# Patient Record
Sex: Female | Born: 1935 | Race: White | Hispanic: No | Marital: Married | Smoking: Never smoker
Health system: Southern US, Community
[De-identification: ages and names within clinical notes are randomized; demographics above are authoritative.]

## PROBLEM LIST (undated history)

## (undated) DIAGNOSIS — E559 Vitamin D deficiency, unspecified: Secondary | ICD-10-CM

## (undated) DIAGNOSIS — R739 Hyperglycemia, unspecified: Secondary | ICD-10-CM

## (undated) DIAGNOSIS — E538 Deficiency of other specified B group vitamins: Secondary | ICD-10-CM

## (undated) DIAGNOSIS — R55 Syncope and collapse: Secondary | ICD-10-CM

## (undated) DIAGNOSIS — K8020 Calculus of gallbladder without cholecystitis without obstruction: Secondary | ICD-10-CM

## (undated) DIAGNOSIS — R079 Chest pain, unspecified: Secondary | ICD-10-CM

## (undated) DIAGNOSIS — E785 Hyperlipidemia, unspecified: Secondary | ICD-10-CM

## (undated) DIAGNOSIS — Z8673 Personal history of transient ischemic attack (TIA), and cerebral infarction without residual deficits: Secondary | ICD-10-CM

## (undated) DIAGNOSIS — I517 Cardiomegaly: Secondary | ICD-10-CM

## (undated) DIAGNOSIS — Z8543 Personal history of malignant neoplasm of ovary: Secondary | ICD-10-CM

## (undated) DIAGNOSIS — IMO0002 Reserved for concepts with insufficient information to code with codable children: Secondary | ICD-10-CM

## (undated) DIAGNOSIS — H8309 Labyrinthitis, unspecified ear: Secondary | ICD-10-CM

## (undated) DIAGNOSIS — N184 Chronic kidney disease, stage 4 (severe): Secondary | ICD-10-CM

## (undated) DIAGNOSIS — E042 Nontoxic multinodular goiter: Secondary | ICD-10-CM

## (undated) DIAGNOSIS — I6523 Occlusion and stenosis of bilateral carotid arteries: Secondary | ICD-10-CM

## (undated) DIAGNOSIS — F0390 Unspecified dementia without behavioral disturbance: Secondary | ICD-10-CM

## (undated) DIAGNOSIS — I10 Essential (primary) hypertension: Secondary | ICD-10-CM

## (undated) HISTORY — PX: BACK SURGERY: SHX140

## (undated) HISTORY — DX: Cardiomegaly: I51.7

## (undated) HISTORY — DX: Labyrinthitis, unspecified ear: H83.09

## (undated) HISTORY — DX: Unspecified dementia, unspecified severity, without behavioral disturbance, psychotic disturbance, mood disturbance, and anxiety: F03.90

## (undated) HISTORY — DX: Personal history of malignant neoplasm of ovary: Z85.43

## (undated) HISTORY — DX: Chest pain, unspecified: R07.9

## (undated) HISTORY — DX: Vitamin D deficiency, unspecified: E55.9

## (undated) HISTORY — DX: Personal history of transient ischemic attack (TIA), and cerebral infarction without residual deficits: Z86.73

## (undated) HISTORY — DX: Chronic kidney disease, stage 4 (severe): N18.4

## (undated) HISTORY — DX: Reserved for concepts with insufficient information to code with codable children: IMO0002

## (undated) HISTORY — DX: Calculus of gallbladder without cholecystitis without obstruction: K80.20

## (undated) HISTORY — DX: Syncope and collapse: R55

## (undated) HISTORY — DX: Occlusion and stenosis of bilateral carotid arteries: I65.23

## (undated) HISTORY — DX: Deficiency of other specified B group vitamins: E53.8

## (undated) HISTORY — DX: Hyperglycemia, unspecified: R73.9

## (undated) HISTORY — DX: Nontoxic multinodular goiter: E04.2

## (undated) HISTORY — DX: Essential (primary) hypertension: I10

## (undated) HISTORY — DX: Hyperlipidemia, unspecified: E78.5

---
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
## 1958-08-03 HISTORY — PX: VAGINAL HYSTERECTOMY: SHX2639

## 1997-11-01 DIAGNOSIS — I129 Hypertensive chronic kidney disease with stage 1 through stage 4 chronic kidney disease, or unspecified chronic kidney disease: Secondary | ICD-10-CM

## 1997-11-01 DIAGNOSIS — I10 Essential (primary) hypertension: Secondary | ICD-10-CM

## 1997-11-01 DIAGNOSIS — E785 Hyperlipidemia, unspecified: Secondary | ICD-10-CM

## 1997-11-01 DIAGNOSIS — N184 Chronic kidney disease, stage 4 (severe): Secondary | ICD-10-CM

## 1997-11-01 HISTORY — DX: Essential (primary) hypertension: I10

## 1997-11-01 HISTORY — DX: Hyperlipidemia, unspecified: E78.5

## 1997-11-14 ENCOUNTER — Other Ambulatory Visit: Admission: RE | Admit: 1997-11-14 | Discharge: 1997-11-14 | Payer: Self-pay | Admitting: Family Medicine

## 1999-06-20 ENCOUNTER — Other Ambulatory Visit: Admission: RE | Admit: 1999-06-20 | Discharge: 1999-06-20 | Payer: Self-pay | Admitting: Family Medicine

## 2000-06-18 ENCOUNTER — Other Ambulatory Visit: Admission: RE | Admit: 2000-06-18 | Discharge: 2000-06-18 | Payer: Self-pay | Admitting: Family Medicine

## 2000-12-01 DIAGNOSIS — IMO0002 Reserved for concepts with insufficient information to code with codable children: Secondary | ICD-10-CM

## 2000-12-01 HISTORY — DX: Reserved for concepts with insufficient information to code with codable children: IMO0002

## 2001-10-01 ENCOUNTER — Encounter: Payer: Self-pay | Admitting: Family Medicine

## 2001-10-01 LAB — CONVERTED CEMR LAB: Pap Smear: NORMAL

## 2001-10-21 ENCOUNTER — Other Ambulatory Visit: Admission: RE | Admit: 2001-10-21 | Discharge: 2001-10-21 | Payer: Self-pay | Admitting: Internal Medicine

## 2004-09-03 DIAGNOSIS — R7309 Other abnormal glucose: Secondary | ICD-10-CM

## 2004-09-09 ENCOUNTER — Ambulatory Visit: Payer: Self-pay | Admitting: Family Medicine

## 2004-09-11 ENCOUNTER — Ambulatory Visit: Payer: Self-pay | Admitting: Family Medicine

## 2004-09-16 ENCOUNTER — Ambulatory Visit: Payer: Self-pay

## 2004-09-18 ENCOUNTER — Ambulatory Visit: Payer: Self-pay | Admitting: Family Medicine

## 2004-11-11 ENCOUNTER — Ambulatory Visit: Payer: Self-pay | Admitting: Family Medicine

## 2005-02-10 ENCOUNTER — Ambulatory Visit: Payer: Self-pay | Admitting: Family Medicine

## 2005-02-19 ENCOUNTER — Ambulatory Visit: Payer: Self-pay

## 2005-02-19 HISTORY — PX: US ECHOCARDIOGRAPHY: HXRAD669

## 2005-04-03 ENCOUNTER — Ambulatory Visit: Payer: Self-pay | Admitting: Family Medicine

## 2005-04-22 ENCOUNTER — Ambulatory Visit: Payer: Self-pay

## 2005-09-03 ENCOUNTER — Encounter: Payer: Self-pay | Admitting: Family Medicine

## 2005-09-03 LAB — CONVERTED CEMR LAB
Hgb A1c MFr Bld: 5.5 %
Microalbumin U total vol: 6.7 mg/L

## 2005-09-22 ENCOUNTER — Ambulatory Visit: Payer: Self-pay | Admitting: Family Medicine

## 2005-09-24 ENCOUNTER — Ambulatory Visit: Payer: Self-pay | Admitting: Family Medicine

## 2005-11-05 ENCOUNTER — Ambulatory Visit: Payer: Self-pay

## 2005-11-25 ENCOUNTER — Ambulatory Visit: Payer: Self-pay | Admitting: Family Medicine

## 2005-11-26 ENCOUNTER — Ambulatory Visit: Payer: Self-pay | Admitting: Family Medicine

## 2006-01-20 ENCOUNTER — Ambulatory Visit: Payer: Self-pay | Admitting: Family Medicine

## 2006-01-22 ENCOUNTER — Ambulatory Visit: Payer: Self-pay | Admitting: Family Medicine

## 2006-06-28 ENCOUNTER — Ambulatory Visit: Payer: Self-pay

## 2006-07-23 ENCOUNTER — Ambulatory Visit: Payer: Self-pay | Admitting: Family Medicine

## 2006-11-03 ENCOUNTER — Ambulatory Visit: Payer: Self-pay | Admitting: Family Medicine

## 2006-11-03 LAB — CONVERTED CEMR LAB
ALT: 18 units/L (ref 0–40)
AST: 23 units/L (ref 0–37)
Albumin: 3.8 g/dL (ref 3.5–5.2)
Alkaline Phosphatase: 79 units/L (ref 39–117)
BUN: 32 mg/dL — ABNORMAL HIGH (ref 6–23)
Basophils Absolute: 0 10*3/uL (ref 0.0–0.1)
Basophils Relative: 0 % (ref 0.0–1.0)
Bilirubin, Direct: 0.1 mg/dL (ref 0.0–0.3)
CO2: 25 meq/L (ref 19–32)
Calcium: 9.2 mg/dL (ref 8.4–10.5)
Chloride: 114 meq/L — ABNORMAL HIGH (ref 96–112)
Cholesterol: 218 mg/dL (ref 0–200)
Creatinine, Ser: 1.6 mg/dL — ABNORMAL HIGH (ref 0.4–1.2)
Creatinine,U: 161.8 mg/dL
Direct LDL: 149.4 mg/dL
Eosinophils Absolute: 0.2 10*3/uL (ref 0.0–0.6)
Eosinophils Relative: 3.5 % (ref 0.0–5.0)
GFR calc Af Amer: 39 mL/min
GFR calc non Af Amer: 33 mL/min
Glucose, Bld: 101 mg/dL — ABNORMAL HIGH (ref 70–99)
HCT: 36.3 % (ref 36.0–46.0)
HDL: 46.2 mg/dL (ref >39.0)
Hemoglobin: 12.1 g/dL (ref 12.0–15.0)
Lymphocytes Relative: 40.3 % (ref 12.0–46.0)
MCHC: 33.4 g/dL (ref 30.0–36.0)
MCV: 89 fL (ref 78.0–100.0)
Microalb Creat Ratio: 6.8 mg/g (ref 0.0–30.0)
Microalb, Ur: 1.1 mg/dL (ref 0.0–1.9)
Monocytes Absolute: 0.4 10*3/uL (ref 0.2–0.7)
Monocytes Relative: 10.2 % (ref 3.0–11.0)
Neutro Abs: 2 10*3/uL (ref 1.4–7.7)
Neutrophils Relative %: 46 % (ref 43.0–77.0)
Platelets: 136 10*3/uL — ABNORMAL LOW (ref 150–400)
Potassium: 4.8 meq/L (ref 3.5–5.1)
RBC: 4.08 M/uL (ref 3.87–5.11)
RDW: 13.8 % (ref 11.5–14.6)
Sodium: 143 meq/L (ref 135–145)
TSH: 1.3 microintl units/mL (ref 0.35–5.50)
Total Bilirubin: 1.1 mg/dL (ref 0.3–1.2)
Total CHOL/HDL Ratio: 4.7
Total Protein: 6.4 g/dL (ref 6.0–8.3)
Triglycerides: 144 mg/dL (ref 0–149)
VLDL: 29 mg/dL (ref 0–40)
WBC: 4.4 10*3/uL — ABNORMAL LOW (ref 4.5–10.5)

## 2006-11-08 ENCOUNTER — Ambulatory Visit: Payer: Self-pay | Admitting: Family Medicine

## 2006-11-09 ENCOUNTER — Ambulatory Visit: Payer: Self-pay

## 2006-11-15 ENCOUNTER — Ambulatory Visit: Payer: Self-pay | Admitting: Family Medicine

## 2006-12-08 ENCOUNTER — Ambulatory Visit: Payer: Self-pay | Admitting: Family Medicine

## 2007-04-18 ENCOUNTER — Encounter: Payer: Self-pay | Admitting: Family Medicine

## 2007-04-18 DIAGNOSIS — Z8543 Personal history of malignant neoplasm of ovary: Secondary | ICD-10-CM

## 2007-04-18 DIAGNOSIS — H8309 Labyrinthitis, unspecified ear: Secondary | ICD-10-CM | POA: Insufficient documentation

## 2007-05-13 ENCOUNTER — Ambulatory Visit: Payer: Self-pay | Admitting: Family Medicine

## 2007-05-13 DIAGNOSIS — N289 Disorder of kidney and ureter, unspecified: Secondary | ICD-10-CM | POA: Insufficient documentation

## 2007-05-15 LAB — CONVERTED CEMR LAB
Albumin: 4 g/dL (ref 3.5–5.2)
BUN: 35 mg/dL — ABNORMAL HIGH (ref 6–23)
CO2: 26 meq/L (ref 19–32)
Calcium: 8.9 mg/dL (ref 8.4–10.5)
Chloride: 111 meq/L (ref 96–112)
Creatinine, Ser: 1.6 mg/dL — ABNORMAL HIGH (ref 0.4–1.2)
GFR calc Af Amer: 39 mL/min
GFR calc non Af Amer: 33 mL/min
Glucose, Bld: 104 mg/dL — ABNORMAL HIGH (ref 70–99)
Phosphorus: 4.1 mg/dL (ref 2.3–4.6)
Potassium: 4.8 meq/L (ref 3.5–5.1)
Sodium: 142 meq/L (ref 135–145)

## 2007-05-17 ENCOUNTER — Ambulatory Visit: Payer: Self-pay

## 2007-05-17 ENCOUNTER — Ambulatory Visit: Payer: Self-pay | Admitting: Family Medicine

## 2007-11-14 ENCOUNTER — Ambulatory Visit: Payer: Self-pay | Admitting: Family Medicine

## 2007-11-14 LAB — CONVERTED CEMR LAB
ALT: 17 units/L (ref 0–35)
AST: 23 units/L (ref 0–37)
Albumin: 4 g/dL (ref 3.5–5.2)
BUN: 34 mg/dL — ABNORMAL HIGH (ref 6–23)
Basophils Relative: 1.1 % — ABNORMAL HIGH (ref 0.0–1.0)
CO2: 24 meq/L (ref 19–32)
Chloride: 109 meq/L (ref 96–112)
Creatinine, Ser: 1.6 mg/dL — ABNORMAL HIGH (ref 0.4–1.2)
Direct LDL: 184.4 mg/dL
Eosinophils Relative: 1.6 % (ref 0.0–5.0)
HDL: 42.3 mg/dL (ref >39.0)
Lymphocytes Relative: 35.1 % (ref 12.0–46.0)
MCV: 92.1 fL (ref 78.0–100.0)
Neutrophils Relative %: 54.3 % (ref 43.0–77.0)
RBC: 4.3 M/uL (ref 3.87–5.11)
VLDL: 36 mg/dL (ref 0–40)
WBC: 5.4 10*3/uL (ref 4.5–10.5)

## 2007-11-17 ENCOUNTER — Ambulatory Visit: Payer: Self-pay | Admitting: Family Medicine

## 2007-12-15 ENCOUNTER — Ambulatory Visit: Payer: Self-pay | Admitting: Cardiology

## 2007-12-21 ENCOUNTER — Encounter: Payer: Self-pay | Admitting: Family Medicine

## 2007-12-21 ENCOUNTER — Encounter: Payer: Self-pay | Admitting: Cardiology

## 2007-12-21 ENCOUNTER — Ambulatory Visit: Payer: Self-pay

## 2007-12-21 HISTORY — PX: OTHER SURGICAL HISTORY: SHX169

## 2007-12-28 ENCOUNTER — Encounter: Payer: Self-pay | Admitting: Family Medicine

## 2008-01-11 ENCOUNTER — Ambulatory Visit: Payer: Self-pay | Admitting: Cardiology

## 2008-05-22 ENCOUNTER — Ambulatory Visit: Payer: Self-pay

## 2008-05-22 ENCOUNTER — Encounter: Payer: Self-pay | Admitting: Family Medicine

## 2008-05-22 ENCOUNTER — Ambulatory Visit: Payer: Self-pay | Admitting: Cardiology

## 2008-10-09 ENCOUNTER — Ambulatory Visit: Payer: Self-pay | Admitting: Family Medicine

## 2008-10-09 DIAGNOSIS — R079 Chest pain, unspecified: Secondary | ICD-10-CM | POA: Insufficient documentation

## 2008-10-09 DIAGNOSIS — I6529 Occlusion and stenosis of unspecified carotid artery: Secondary | ICD-10-CM | POA: Insufficient documentation

## 2009-05-07 ENCOUNTER — Encounter: Payer: Self-pay | Admitting: Cardiology

## 2009-05-07 ENCOUNTER — Ambulatory Visit: Payer: Self-pay

## 2009-05-15 ENCOUNTER — Ambulatory Visit: Payer: Self-pay | Admitting: Cardiology

## 2009-05-15 DIAGNOSIS — R55 Syncope and collapse: Secondary | ICD-10-CM

## 2009-11-04 ENCOUNTER — Encounter: Payer: Self-pay | Admitting: Cardiology

## 2009-11-05 ENCOUNTER — Ambulatory Visit: Payer: Self-pay

## 2009-11-05 ENCOUNTER — Encounter: Payer: Self-pay | Admitting: Cardiology

## 2010-03-11 ENCOUNTER — Encounter (INDEPENDENT_AMBULATORY_CARE_PROVIDER_SITE_OTHER): Payer: Self-pay | Admitting: *Deleted

## 2010-05-05 ENCOUNTER — Encounter: Payer: Self-pay | Admitting: Cardiology

## 2010-05-06 ENCOUNTER — Ambulatory Visit: Payer: Self-pay

## 2010-05-06 ENCOUNTER — Encounter: Payer: Self-pay | Admitting: Cardiology

## 2010-06-02 ENCOUNTER — Ambulatory Visit: Payer: Self-pay | Admitting: Cardiovascular Disease

## 2010-06-05 ENCOUNTER — Ambulatory Visit: Payer: Self-pay | Admitting: Family Medicine

## 2010-07-09 ENCOUNTER — Ambulatory Visit: Payer: Self-pay | Admitting: Family Medicine

## 2010-08-14 ENCOUNTER — Ambulatory Visit
Admission: RE | Admit: 2010-08-14 | Discharge: 2010-08-14 | Payer: Self-pay | Source: Home / Self Care | Attending: Family Medicine | Admitting: Family Medicine

## 2010-08-14 ENCOUNTER — Other Ambulatory Visit: Payer: Self-pay | Admitting: Family Medicine

## 2010-08-14 LAB — CBC WITH DIFFERENTIAL/PLATELET
Basophils Absolute: 0 10*3/uL (ref 0.0–0.1)
Basophils Relative: 0.4 % (ref 0.0–3.0)
Eosinophils Absolute: 0.1 10*3/uL (ref 0.0–0.7)
Eosinophils Relative: 2.3 % (ref 0.0–5.0)
HCT: 36.6 % (ref 36.0–46.0)
Hemoglobin: 12.4 g/dL (ref 12.0–15.0)
Lymphocytes Relative: 34.3 % (ref 12.0–46.0)
Lymphs Abs: 2.1 10*3/uL (ref 0.7–4.0)
MCHC: 33.8 g/dL (ref 30.0–36.0)
MCV: 91.7 fl (ref 78.0–100.0)
Monocytes Absolute: 0.5 10*3/uL (ref 0.1–1.0)
Monocytes Relative: 8.8 % (ref 3.0–12.0)
Neutro Abs: 3.3 10*3/uL (ref 1.4–7.7)
Neutrophils Relative %: 54.2 % (ref 43.0–77.0)
Platelets: 176 10*3/uL (ref 150.0–400.0)
RBC: 3.99 Mil/uL (ref 3.87–5.11)
RDW: 13.3 % (ref 11.5–14.6)
WBC: 6.2 10*3/uL (ref 4.5–10.5)

## 2010-08-14 LAB — TSH: TSH: 1.86 u[IU]/mL (ref 0.35–5.50)

## 2010-08-14 LAB — RENAL FUNCTION PANEL
Albumin: 4.2 g/dL (ref 3.5–5.2)
BUN: 45 mg/dL — ABNORMAL HIGH (ref 6–23)
CO2: 20 mEq/L (ref 19–32)
Calcium: 8.6 mg/dL (ref 8.4–10.5)
Chloride: 112 mEq/L (ref 96–112)
Creatinine, Ser: 2 mg/dL — ABNORMAL HIGH (ref 0.4–1.2)
GFR: 25 mL/min — ABNORMAL LOW (ref >60.00)
Glucose, Bld: 90 mg/dL (ref 70–99)
Phosphorus: 4.8 mg/dL — ABNORMAL HIGH (ref 2.3–4.6)
Potassium: 4.9 mEq/L (ref 3.5–5.1)
Sodium: 142 mEq/L (ref 135–145)

## 2010-08-14 LAB — LIPID PANEL
Cholesterol: 176 mg/dL (ref 0–200)
HDL: 51.9 mg/dL (ref >39.00)
LDL Cholesterol: 100 mg/dL — ABNORMAL HIGH (ref 0–99)
Total CHOL/HDL Ratio: 3
Triglycerides: 121 mg/dL (ref 0.0–149.0)
VLDL: 24.2 mg/dL (ref 0.0–40.0)

## 2010-08-14 LAB — HEPATIC FUNCTION PANEL
ALT: 14 U/L (ref 0–35)
AST: 18 U/L (ref 0–37)
Albumin: 4.2 g/dL (ref 3.5–5.2)
Alkaline Phosphatase: 65 U/L (ref 39–117)
Bilirubin, Direct: 0.1 mg/dL (ref 0.0–0.3)
Total Bilirubin: 1 mg/dL (ref 0.3–1.2)
Total Protein: 6.7 g/dL (ref 6.0–8.3)

## 2010-08-14 LAB — MICROALBUMIN / CREATININE URINE RATIO
Creatinine,U: 118 mg/dL
Microalb Creat Ratio: 1.6 mg/g (ref 0.0–30.0)
Microalb, Ur: 1.9 mg/dL (ref 0.0–1.9)

## 2010-08-20 ENCOUNTER — Encounter: Payer: Self-pay | Admitting: Family Medicine

## 2010-08-20 ENCOUNTER — Ambulatory Visit
Admission: RE | Admit: 2010-08-20 | Discharge: 2010-08-20 | Payer: Self-pay | Source: Home / Self Care | Attending: Family Medicine | Admitting: Family Medicine

## 2010-08-20 DIAGNOSIS — N184 Chronic kidney disease, stage 4 (severe): Secondary | ICD-10-CM | POA: Insufficient documentation

## 2010-09-02 NOTE — Miscellaneous (Signed)
Summary: Orders Update  Clinical Lists Changes  Orders: Added new Test order of Carotid Duplex (Carotid Duplex) - Signed 

## 2010-09-02 NOTE — Miscellaneous (Signed)
Summary: Orders Update  Clinical Lists Changes 

## 2010-09-02 NOTE — Assessment & Plan Note (Signed)
Summary: 1 MONTH FOLLOW UP/[facility]   Vital Signs:  Patient profile:   77 year old female Weight:      154.75 pounds Temp:     97.5 degrees F oral Pulse rate:   60 / minute Pulse rhythm:   regular BP sitting:   138 / 68  (left arm) Cuff size:   large  Vitals Entered By: Sydell Axon LPN (July 09, 2010 11:40 AM) CC: One month follow-up   History of Present Illness: Pt here for one month followup of her BP. She had just had her medication adjusted at [facility] prior to being seen last time and needed time to equilibrate on that medication. She feels well but is developing some wobbliness with getting up that she identifies as the same thing her sister had when she fell backward off her porch into a hole, hitting her head and dying a week later in ICU. She is tearful telling this story and not worried but wants to share her sxs. She has Carotid Dz, hasn't been checked for two years duet to not being seen. She feels well otherwise.  Problems Prior to Update: 1)  Syncope and Collapse  (ICD-780.2) 2)  Carotid Artery Stenosis, Bilateral  (ICD-433.10) 3)  Chest Pain  (ICD-786.50) 4)  Health Maintenance Exam  (ICD-V70.0) 5)  Disorder, Kidney/ureter Nos  (ICD-593.9) 6)  Hyperglycemia  (ICD-790.29) 7)  Cardiomegaly, Mild Via Xray  (ICD-429.3) 8)  Cholelithiasis  (ICD-574.20) 9)  Hx, Personal, Malignancy, Ovary  (ICD-V10.43) 10)  Vertigo Secondary To Labrynthitis  (ICD-386.30) 11)  Hyperlipidemia  (ICD-272.4) 12)  Hypertension  (ICD-401.9)  Medications Prior to Update: 1)  Norvasc 5 Mg  Tabs (Amlodipine Besylate) .... Take 1 Tablet By Mouth Once A Day 2)  Captopril 50 Mg  Tabs (Captopril) .... Take 1 Tablet By Mouth Three Times A Day 3)  Maxzide 75-50 Mg  Tabs (Triamterene-Hctz) .... Take 1 Tablet By Mouth Once A Day 4)  Propranolol Hcl 40 Mg  Tabs (Propranolol Hcl) .Marland Kitchen.. 1 and 1/2 Tablets By Mouth Twice A Day 5)  Crestor 10 Mg Tabs (Rosuvastatin Calcium) .... Take One Tablet By Mouth  Daily. 6)  Aspirin 81 Mg Tbec (Aspirin) .... Take One Tablet By Mouth As Needed  Allergies: 1)  ! Pcn  Physical Exam  General:  Well-developed,well-nourished,in no acute distress; alert,appropriate and cooperative throughout examination Head:  Normocephalic and atraumatic without obvious abnormalities. No apparent alopecia or balding. Eyes:  Conjunctiva clear bilaterally.  Ears:  External ear exam shows no significant lesions or deformities.  Otoscopic examination reveals clear canals, tympanic membranes are intact bilaterally without bulging, retraction, inflammation or discharge. Hearing is grossly normal bilaterally. Nose:  External nasal examination shows no deformity or inflammation. Nasal mucosa are pink and moist without lesions or exudates. Mouth:  Oral mucosa and oropharynx without lesions or exudates.  Teeth in good repair. Neck:  No deformities, masses, or tenderness noted. Mildly tender to palpation laterally, not c/w ant or post cerv chains. No swelling or color changes noted. Chest Wall:  No deformities, masses, or tenderness noted. Lungs:  Normal respiratory effort, chest expands symmetrically. Lungs are clear to auscultation, no crackles or wheezes. Heart:  Normal rate and regular rhythm. S1 and S2 normal without gallop, murmur, click, rub or other extra sounds. Abdomen:  Bowel sounds positive,abdomen soft and non-tender without masses, organomegaly or hernias noted. Neurologic:  No cranial nerve deficits noted. Station and gait are normal for age.  Sensory, motor and coordinative functions appear  intact.   Impression & Recommendations:  Problem # 1:  HYPERTENSION (ICD-401.9) Assessment Unchanged BP 165/70 by me. Will increase Captopril to 100mg  three times a day. Take 11/2 of 50mg  tabs until gone and then fill script for 100mg  tabs.  Be careful with lightheadedness for a few days until used to the new dose. RTC early if dizziness continues. Her updated medication list for  this problem includes:    Norvasc 5 Mg Tabs (Amlodipine besylate) .Marland Kitchen... Take 1 tablet by mouth once a day    Captopril 100 Mg Tabs (Captopril) ..... One tab by mouth three times a day    Maxzide 75-50 Mg Tabs (Triamterene-hctz) .Marland Kitchen... Take 1 tablet by mouth once a day    Propranolol Hcl 40 Mg Tabs (Propranolol hcl) .Marland Kitchen... 1 and 1/2 tablets by mouth twice a day  BP today: 138/68 Prior BP: 150/70 (06/05/2010)  Labs Reviewed: K+: 4.5 (11/14/2007) Creat: : 1.6 (11/14/2007)   Chol: 261 (11/14/2007)   HDL: 42.3 (11/14/2007)   LDL: DEL (11/14/2007)   TG: 178 (11/14/2007)  Problem # 2:  CAROTID ARTERY STENOSIS, BILATERAL (ICD-433.10) Assessment: Unchanged  Will get Carotid U/s next time if BP controlled or better. Her updated medication list for this problem includes:    Aspirin 81 Mg Tbec (Aspirin) .Marland Kitchen... Take one tablet by mouth as needed  Carotid Duplex Scan: Progression of bilateral carotid artery disease 60-79% RICA stenosis 40-59% LICA stenosis  (05/07/2009)  Echocardiogram:   -  Left ventricular ejection fraction was estimated to be 60 %. Left         ventricular wall thickness was mildly increased. Features         were consistent with a pseudonormal left ventricular filling         pattern, with concomitant abnormal relaxation and increased         filling pressure.   -  The aortic valve was mildly to moderately calcified. There was         mild aortic valvular regurgitation. The mean transaortic         valve gradient was 6 mmHg. Estimated aortic valve area (by         VTI) was 2.89 cm^2. Estimated aortic valve area (by Vmax) was         3.19 cm^2.   -  There was mild ascending aortic dilatation.   -  There was mild mitral annular calcification. There was mild         mitral valvular regurgitation.   -  The left atrium was mild to moderately dilated. (12/21/2007)  Complete Medication List: 1)  Norvasc 5 Mg Tabs (Amlodipine besylate) .... Take 1 tablet by mouth once a day 2)   Captopril 100 Mg Tabs (Captopril) .... One tab by mouth three times a day 3)  Maxzide 75-50 Mg Tabs (Triamterene-hctz) .... Take 1 tablet by mouth once a day 4)  Propranolol Hcl 40 Mg Tabs (Propranolol hcl) .Marland Kitchen.. 1 and 1/2 tablets by mouth twice a day 5)  Crestor 10 Mg Tabs (Rosuvastatin calcium) .... Take one tablet by mouth daily. 6)  Aspirin 81 Mg Tbec (Aspirin) .... Take one tablet by mouth as needed  Patient Instructions: 1)  RTC as scheduled. 2)  Get Carotid U/S next time. Prescriptions: CAPTOPRIL 100 MG TABS (CAPTOPRIL) one tab by mouth three times a day  #90 x 12   Entered and Authorized by:   Shaune Leeks MD   Signed by:   Shaune Leeks  MD on 07/09/2010   Method used:   Print then Give to Patient   RxID:   [id number]    Orders Added: 1)  Est. Patient Level III [50093]    Current Allergies (reviewed today): ! PCN

## 2010-09-02 NOTE — Assessment & Plan Note (Signed)
Summary: EC6/AMD  Medications Added NORVASC 5 MG  TABS (AMLODIPINE BESYLATE) Take 1 tablet by mouth once a day CAPTOPRIL 50 MG  TABS (CAPTOPRIL) Take 1 tablet by mouth three times a day PROPRANOLOL HCL 40 MG  TABS (PROPRANOLOL HCL) 1 and 1/2 tablets by mouth twice a day CRESTOR 10 MG TABS (ROSUVASTATIN CALCIUM) Take one tablet by mouth daily. ASPIRIN 81 MG TBEC (ASPIRIN) Take one tablet by mouth daily      Allergies Added:   Visit Type:  Initial Consult Referring Provider:  Dr. Daleen Fuentes Primary Provider:  Shaune Leeks MD  CC:  Angela Fuentes. has shortness of breath.  Denies chest pain.Marland Kitchen  History of Present Illness: [name] is a very pleasant 77 year old woman with a remote history of syncope, hyperlipidemia, peripheral vascular disease with bilateral carotid arterial disease that has been stable, hypertension who presents for routine followup.  she reports that she has been out of her medications for the past few days. Her blood pressure is elevated in the office today. She is otherwise been feeling well with no complaints apart from some neck discomfort which has been chronic. She denies any chest pain, is able to exert herself without any difficulty. She used to be on a cholesterol medication as well as aspirin and she is uncertain why she's not taking his medications. She denies ever having any allergies or side effects from medications such as myalgias or bleeding.  EKG shows normal sinus rhythm with rate 69 beats per minute, no significant ST or T wave changes  Current Medications (verified): 1)  Norvasc 5 Mg  Tabs (Amlodipine Besylate) .... Take 1 Tablet By Mouth Once A Day 2)  Captopril 50 Mg  Tabs (Captopril) .... Take 1 Tablet By Mouth Three Times A Day 3)  Maxzide 75-50 Mg  Tabs (Triamterene-Hctz) .... Take 1 Tablet By Mouth Once A Day 4)  Propranolol Hcl 40 Mg  Tabs (Propranolol Hcl) .Marland Kitchen.. 1 and 1/2 Tablets By Mouth Twice A Day  Allergies (verified): 1)  ! Pcn  Past  History:  Past Medical History: Last updated: 04/18/2007 Hypertension (11/01/1997) Hyperlipidemia (11/01/1997) Cholelithiasis ([birth year]1949)  Past Surgical History: Last updated: 10/11/2008 Back Surg L5/S1 Dr Angela Fuentes Cervical series/MRI neck multi lvl degen changes 5/02 Dexa nml 1/02 Cervical x-ray DDD 11/04 Hysterectomy ovarian cancer 1960's Carotid U/S mild  to mod plaque R)  greater than L) 09/16/04 Echo EF 65% tr AR grossly nml 02/19/05 Carotid U/S 60-79 RICA stable 11/05/05 Carotid U/S 60-79 RICA stable 06/30/06 Carotid U/S stable 60-79 RICA 0-39  LICA 11/09/06 Carotid U/S stable 60-79 RICA 40-59 LICA 05/17/07 Adenosine Myoview Nml 12/21/07 Carotid U/S 60-79 RICA 0-39 LICA stable  05/22/08  Family History: Last updated: 11/17/2007 Father dec Prostate Ca Mother dec 71 HTN CVA(Hemm) Brother dec killed Brother dec Parkinson's, starved to death Brother dec Emphysema Sister dec NH Blind (Angela Fuentes) SIster dec Early Dementia (Angela Fuentes) Sister A Angela Fuentes) Sister A Angela Fuentes)  Social History: Last updated: 04/18/2007 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 Married lives w/ husb  0children  Risk Factors: Caffeine Use: 3 (11/17/2007) Exercise: yes (11/17/2007)  Risk Factors: Smoking Status: never (04/18/2007) Passive Smoke Exposure: no (11/17/2007)  Review of Systems  The patient denies fever, weight loss, weight gain, vision loss, decreased hearing, hoarseness, chest pain, syncope, dyspnea on exertion, peripheral edema, prolonged cough, abdominal pain, incontinence, muscle weakness, depression, and enlarged lymph nodes.    Vital Signs:  Patient profile:   77 year old female Height:  65 inches Weight:      153 pounds BMI:     25.55 Pulse rate:   72 / minute BP sitting:   172 / 73  (left arm) Cuff size:   large  Vitals Entered By: Angela Fuentes, CMA (June 02, 2010 2:15 PM)  Physical Exam  General:  Well developed, well nourished, in no acute  distress. appears younger than her stated age Head:  normocephalic and atraumatic Neck:  Neck supple, no JVD. No masses, thyromegaly or abnormal cervical nodes. Lungs:  Clear bilaterally to auscultation and percussion. Heart:  Non-displaced PMI, chest non-tender; regular rate and rhythm, S1, S2 without murmurs, rubs or gallops. Carotid upstroke normal, 1+ bruit. Pedals normal pulses. No edema, no varicosities. Abdomen:  Bowel sounds positive; abdomen soft and non-tender without masses Msk:  Back normal, normal gait. Muscle strength and tone normal. Pulses:  pulses normal in all 4 extremities Extremities:  No clubbing or cyanosis. Neurologic:  Alert and oriented x 3. Skin:  Intact without lesions or rashes. Psych:  Normal affect.   Impression & Recommendations:  Problem # 1:  CAROTID ARTERY STENOSIS, BILATERAL (ICD-433.10) continue aggressive lipid management. She is not chronically on a statin and we will add Crestor 10 mg daily. She was previously on simvastatin 80 mg daily she is on a calcium channel blocker, this dose would have to be significantly decreased. We can change her Crestor to Lipitor if cost is an issue when it goes generic. Goal LDL is less than 70 given her severe carotid arterial disease. Add Aspirin 81 mg daily.  The following medications were removed from the medication list:    Adult Aspirin Low Strength 81 Mg Tbdp (Aspirin) .Marland Kitchen... Take 1 tablet by mouth once a day Her updated medication list for this problem includes:    Aspirin 81 Mg Tbec (Aspirin) .Marland Kitchen... Take one tablet by mouth daily  Orders: EKG w/ Interpretation (93000)  Problem # 2:  HYPERLIPIDEMIA (ICD-272.4) Add a statin as detailed above with a cholesterol check in 3 months time.  Her updated medication list for this problem includes:    Crestor 10 Mg Tabs (Rosuvastatin calcium) .Marland Kitchen... Take one tablet by mouth daily.  Problem # 3:  HYPERTENSION (ICD-401.9) Blood pressure is elevated today and she has  run out of several of her medicines. We will renew her medications. We'll also recommend followup with  [facility] in one month's time to review her blood pressure and medications.  The following medications were removed from the medication list:    Adult Aspirin Low Strength 81 Mg Tbdp (Aspirin) .Marland Kitchen... Take 1 tablet by mouth once a day Her updated medication list for this problem includes:    Norvasc 5 Mg Tabs (Amlodipine besylate) .Marland Kitchen... Take 1 tablet by mouth once a day    Captopril 50 Mg Tabs (Captopril) .Marland Kitchen... Take 1 tablet by mouth three times a day    Maxzide 75-50 Mg Tabs (Triamterene-hctz) .Marland Kitchen... Take 1 tablet by mouth once a day    Propranolol Hcl 40 Mg Tabs (Propranolol hcl) .Marland Kitchen... 1 and 1/2 tablets by mouth twice a day    Aspirin 81 Mg Tbec (Aspirin) .Marland Kitchen... Take one tablet by mouth daily  Patient Instructions: 1)  Your physician has recommended you make the following change in your medication: START crestor daily and aspirin 81mg   2)  Your physician wants you to follow-up in:   6 months You will receive a reminder letter in the mail two months in advance. If  you don't receive a letter, please call our office to schedule the follow-up appointment. 3)  Your physician recommends that you schedule a follow-up appointment in: with Dr. Milinda Antis or Dayton Martes first available  Prescriptions: CRESTOR 10 MG TABS (ROSUVASTATIN CALCIUM) Take one tablet by mouth daily.  #30 x 6   Entered by:   Benedict Needy, RN   Authorized by:   Dossie Arbour MD   Signed by:   Benedict Needy, RN on 06/02/2010   Method used:   Electronically to        [facility] [phone]* (retail)       [address] [place], [place]  [address]       Ph: [phone]       Fax: [phone]   RxID:   [id number] PROPRANOLOL HCL 40 MG  TABS (PROPRANOLOL HCL) 1 and 1/2 tablets by mouth twice a day  #90 x 6   Entered by:   Benedict Needy, RN   Authorized by:   Dossie Arbour MD   Signed by:   Benedict Needy, RN  on 06/02/2010   Method used:   Electronically to        [facility] [phone]* (retail)       [address] [place], [place]  [address]       Ph: [phone]       Fax: [phone]   RxID:   [id number] MAXZIDE 75-50 MG  TABS (TRIAMTERENE-HCTZ) Take 1 tablet by mouth once a day  #30 x 6   Entered by:   Benedict Needy, RN   Authorized by:   Dossie Arbour MD   Signed by:   Benedict Needy, RN on 06/02/2010   Method used:   Electronically to        [facility] [phone]* (retail)       [address] [place], [place]  [address]       Ph: [phone]       Fax: [phone]   RxID:   [id number] CAPTOPRIL 50 MG  TABS (CAPTOPRIL) Take 1 tablet by mouth three times a day  #90 x 6   Entered by:   Benedict Needy, RN   Authorized by:   Dossie Arbour MD   Signed by:   Benedict Needy, RN on 06/02/2010   Method used:   Electronically to        [facility] [phone]* (retail)       [address] [place], [place]  [address]       Ph: [phone]       Fax: [phone]   RxID:   [id number] NORVASC 5 MG  TABS (AMLODIPINE BESYLATE) Take 1 tablet by mouth once a day  #30 x 6   Entered by:   Benedict Needy, RN   Authorized by:   Dossie Arbour MD   Signed by:   Benedict Needy, RN on 06/02/2010   Method used:   Electronically to        [facility] [phone]* (retail)       [address] [place], [place]  [address]       Ph: [phone]       Fax: [phone]   RxID:   [id number]

## 2010-09-02 NOTE — Letter (Signed)
Summary: Nadara Eaton letter  [place] at [facility]  [address] [place], [place] [address]   Phone: [phone]  Fax: [phone]       03/11/2010 MRN: [id number]  [facility] [address] [place], [place]  [address]  Dear Ms. [facility] [place], and [facility] announce the retirement of Arta Silence, M.D., from full-time practice at the [facility] office effective January 30, 2010 and his plans of returning part-time.  It is important to Dr. Hetty Ely and to our practice that you understand that [facility] has seven physicians in our office for your health care needs.  We will continue to offer the same exceptional care that you have today.    Dr. Hetty Ely has spoken to many of you about his plans for retirement and returning part-time in the fall.   We will continue to work with you through the transition to schedule appointments for you in the office and meet the high standards that [facility] is committed to.   Again, it is with great pleasure that we share the news that Dr. Hetty Ely will return to [facility] in October of 2011 with a reduced schedule.    If you have any questions, or would like to request an appointment with one of our physicians, please call us at [phone] and press the option for Scheduling an appointment.  We take pleasure in providing you with excellent patient care and look forward to seeing you at your next office visit.  Our [facility] Physicians are:  Tillman Abide, M.D. Laurita Quint, M.D. Roxy Manns, M.D. Kerby Nora, M.D. Hannah Beat, M.D. Ruthe Mannan, M.D. We proudly welcomed Raechel Ache, M.D. and Eustaquio Boyden, M.D. to the practice in July/August 2011.  Sincerely,  [facility]

## 2010-09-02 NOTE — Assessment & Plan Note (Signed)
Summary: F/U/CLE   Vital Signs:  Patient profile:   77 year old female Weight:      155.25 pounds Temp:     97.3 degrees F oral Pulse rate:   64 / minute Pulse rhythm:   regular BP sitting:   150 / 70  (left arm) Cuff size:   large  Vitals Entered By: Sydell Axon LPN (June 05, 2010 2:39 PM) CC: follow-up visit   History of Present Illness: Pt has not been here in a long time. She was started on Crestor by Dr Mariah Milling recently. She feels fine most of the time. She is dizzy when she gets up and this is chronic.  Her sister fell off of her porch and was taken to [facility]  and then died quickly therafter, 31/2 -4 yrs ago. She was not supposed to go out of the house.....she was stubborn....we are all stubborn.   Her last U/S was 4/11. She had been out of her meds for a few days and were refilled by Dr Mariah Milling.  She has not felt bad so did not think she needed to be seen. She is fine today except for soreness of the neck, laterally.  Allergies: 1)  ! Pcn  Physical Exam  General:  Well-developed,well-nourished,in no acute distress; alert,appropriate and cooperative throughout examination Head:  Normocephalic and atraumatic without obvious abnormalities. No apparent alopecia or balding. Eyes:  Conjunctiva clear bilaterally.  Ears:  External ear exam shows no significant lesions or deformities.  Otoscopic examination reveals clear canals, tympanic membranes are intact bilaterally without bulging, retraction, inflammation or discharge. Hearing is grossly normal bilaterally. Nose:  External nasal examination shows no deformity or inflammation. Nasal mucosa are pink and moist without lesions or exudates. Mouth:  Oral mucosa and oropharynx without lesions or exudates.  Teeth in good repair. Neck:  No deformities, masses, or tenderness noted. Mildly tender to palpation laterally, not c/w ant or post cerv chains. No swelling or color changes noted. Chest Wall:  No deformities, masses, or  tenderness noted. Lungs:  Normal respiratory effort, chest expands symmetrically. Lungs are clear to auscultation, no crackles or wheezes. Heart:  Normal rate and regular rhythm. S1 and S2 normal without gallop, murmur, click, rub or other extra sounds.   Impression & Recommendations:  Problem # 1:  HYPERTENSION (ICD-401.9) Assessment Improved Better than at cardiology but still mlildly elevated. Will recheck in one month and adjust meds as needed. Her updated medication list for this problem includes:    Norvasc 5 Mg Tabs (Amlodipine besylate) .Marland Kitchen... Take 1 tablet by mouth once a day    Captopril 50 Mg Tabs (Captopril) .Marland Kitchen... Take 1 tablet by mouth three times a day    Maxzide 75-50 Mg Tabs (Triamterene-hctz) .Marland Kitchen... Take 1 tablet by mouth once a day    Propranolol Hcl 40 Mg Tabs (Propranolol hcl) .Marland Kitchen... 1 and 1/2 tablets by mouth twice a day  BP today: 150/70 Prior BP: 172/73 (06/02/2010)  Labs Reviewed: K+: 4.5 (11/14/2007) Creat: : 1.6 (11/14/2007)   Chol: 261 (11/14/2007)   HDL: 42.3 (11/14/2007)   LDL: DEL (11/14/2007)   TG: 178 (11/14/2007)  Problem # 2:  CAROTID ARTERY STENOSIS, BILATERAL (ICD-433.10) Assessment: Unchanged UTD, U/S in 4/12. Her updated medication list for this problem includes:    Aspirin 81 Mg Tbec (Aspirin) .Marland Kitchen... Take one tablet by mouth as needed  Problem # 3:  DISORDER, KIDNEY/URETER NOS (ICD-593.9) Assessment: Unchanged Will recheck with Comp Exam.  Problem # 4:  HYPERLIPIDEMIA (  ICD-272.4) Assessment: Unchanged  Will recheck at Comp Exam. Just put on Crestor. Her updated medication list for this problem includes:    Crestor 10 Mg Tabs (Rosuvastatin calcium) .Marland Kitchen... Take one tablet by mouth daily.  Labs Reviewed: SGOT: 23 (11/14/2007)   SGPT: 17 (11/14/2007)   HDL:42.3 (11/14/2007), 46.2 (11/03/2006)  LDL:DEL (11/14/2007), DEL (11/03/2006)  Chol:261 (11/14/2007), 218 (11/03/2006)  Trig:178 (11/14/2007), 144 (11/03/2006)  Complete Medication List: 1)   Norvasc 5 Mg Tabs (Amlodipine besylate) .... Take 1 tablet by mouth once a day 2)  Captopril 50 Mg Tabs (Captopril) .... Take 1 tablet by mouth three times a day 3)  Maxzide 75-50 Mg Tabs (Triamterene-hctz) .... Take 1 tablet by mouth once a day 4)  Propranolol Hcl 40 Mg Tabs (Propranolol hcl) .Marland Kitchen.. 1 and 1/2 tablets by mouth twice a day 5)  Crestor 10 Mg Tabs (Rosuvastatin calcium) .... Take one tablet by mouth daily. 6)  Aspirin 81 Mg Tbec (Aspirin) .... Take one tablet by mouth as needed  Patient Instructions: 1)  RTC 1 mo for BP check. 2)  Pls schedule for Comp Exam when able with labs prior.   Orders Added: 1)  Est. Patient Level III [[address]]    Current Allergies (reviewed today): ! PCN

## 2010-09-04 NOTE — Assessment & Plan Note (Signed)
Summary: CPX/RBH   Vital Signs:  Patient profile:   77 year old female Weight:      152 pounds Temp:     97.6 degrees F oral Pulse rate:   64 / minute Pulse rhythm:   regular BP sitting:   120 / 76  (left arm) Cuff size:   large  Vitals Entered By: Sydell Axon LPN (August 20, 2010 10:41 AM) CC: 30 Minute checkup   History of Present Illness: Pt here for Comp Exam. She continues to have dizziness that happens typically when she gets up from sitting. Her back bothers her a lot which goes away with sitting for a while. It is not the kind of pain she had with her cancer in the past.  Preventive Screening-Counseling & Management  Alcohol-Tobacco     Alcohol drinks/day: 0     Smoking Status: never     Passive Smoke Exposure: no  Caffeine-Diet-Exercise     Caffeine use/day: 1     Does Patient Exercise: no     Type of exercise: walks a little     Times/week: 3  Problems Prior to Update: 1)  Syncope and Collapse  (ICD-780.2) 2)  Carotid Artery Stenosis, Bilateral  (ICD-433.10) 3)  Chest Pain  (ICD-786.50) 4)  Health Maintenance Exam  (ICD-V70.0) 5)  Disorder, Kidney/ureter Nos  (ICD-593.9) 6)  Hyperglycemia  (ICD-790.29) 7)  Cardiomegaly, Mild Via Xray  (ICD-429.3) 8)  Cholelithiasis  (ICD-574.20) 9)  Hx, Personal, Malignancy, Ovary  (ICD-V10.43) 10)  Vertigo Secondary To Labrynthitis  (ICD-386.30) 11)  Hyperlipidemia  (ICD-272.4) 12)  Hypertension  (ICD-401.9)  Medications Prior to Update: 1)  Norvasc 5 Mg  Tabs (Amlodipine Besylate) .... Take 1 Tablet By Mouth Once A Day 2)  Captopril 100 Mg Tabs (Captopril) .... One Tab By Mouth Three Times A Day 3)  Maxzide 75-50 Mg  Tabs (Triamterene-Hctz) .... Take 1 Tablet By Mouth Once A Day 4)  Propranolol Hcl 40 Mg  Tabs (Propranolol Hcl) .Marland Kitchen.. 1 and 1/2 Tablets By Mouth Twice A Day 5)  Crestor 10 Mg Tabs (Rosuvastatin Calcium) .... Take One Tablet By Mouth Daily. 6)  Aspirin 81 Mg Tbec (Aspirin) .... Take One Tablet By Mouth  As Needed  Current Medications (verified): 1)  Norvasc 5 Mg  Tabs (Amlodipine Besylate) .... Take 1 Tablet By Mouth Once A Day 2)  Maxzide 75-50 Mg  Tabs (Triamterene-Hctz) .... Take 1 Tablet By Mouth Once A Day 3)  Propranolol Hcl 40 Mg  Tabs (Propranolol Hcl) .Marland Kitchen.. 1 and 1/2 Tablets By Mouth Twice A Day 4)  Crestor 10 Mg Tabs (Rosuvastatin Calcium) .... Take One Tablet By Mouth Daily. 5)  Aspirin 81 Mg Tbec (Aspirin) .... Take One Tablet By Mouth As Needed 6)  Captopril 50 Mg Tabs (Captopril) .... Take  One By Mouth Three Times A Day  Allergies: 1)  ! Pcn  Past History:  Past Medical History: Last updated: 04/18/2007 Hypertension (11/01/1997) Hyperlipidemia (11/01/1997) Cholelithiasis (08/03/1949)  Past Surgical History: Last updated: 10/11/2008 Back Surg L5/S1 Dr Ocie Bob Cervical series/MRI neck multi lvl degen changes 5/02 Dexa nml 1/02 Cervical x-ray DDD 11/04 Hysterectomy ovarian cancer 1960's Carotid U/S mild  to mod plaque R)  greater than L) 09/16/04 Echo EF 65% tr AR grossly nml 02/19/05 Carotid U/S 60-79 RICA stable 11/05/05 Carotid U/S 60-79 RICA stable 06/30/06 Carotid U/S stable 60-79 RICA 0-39  LICA 11/09/06 Carotid U/S stable 60-79 RICA 40-59 LICA 05/17/07 Adenosine Myoview Nml 12/21/07 Carotid U/S 60-79 RICA 0-39  LICA stable  05/22/08  Family History: Last updated: 08/20/2010 Father dec Prostate Ca Mother dec 71 HTN CVA(Hemm) Brother dec killed Brother dec Parkinson's, starved to death Brother dec Emphysema Sister dec NH Blind (MABEL) SIster dec Early Dementia (Myrtle Inge) Sister A 50 Renea Ee) Sister A 26 Doristine Johns)  Social History: Last updated: 04/18/2007 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 Married lives w/ husb  0children  Risk Factors: Alcohol Use: 0 (08/20/2010) Caffeine Use: 1 (08/20/2010) Exercise: no (08/20/2010)  Risk Factors: Smoking Status: never (08/20/2010) Passive Smoke Exposure: no (08/20/2010)  Family  History: Father dec Prostate Ca Mother dec 71 HTN CVA(Hemm) Brother dec killed Brother dec Parkinson's, starved to death Brother dec Emphysema Sister dec NH Blind (MABEL) SIster dec Early Dementia (Myrtle Inge) Sister A 33 Renea Ee) Sister A 44 Doristine Johns)  Social History: Caffeine use/day:  1 Does Patient Exercise:  no  Review of Systems General:  Denies chills, fatigue, fever, sweats, weakness, and weight loss. Eyes:  Denies blurring, discharge, and eye pain; No chasnges..was turned down for her driver's license.. ENT:  Complains of decreased hearing; denies earache and ringing in ears. CV:  Complains of chest pain or discomfort and swelling of feet; denies fainting, fatigue, and shortness of breath with exertion; occas, much like her mother was. Occas swelling Has lightheadedness. Resp:  Denies cough, shortness of breath, and wheezing. GI:  Denies abdominal pain, bloody stools, change in bowel habits, constipation, dark tarry stools, diarrhea, indigestion, loss of appetite, nausea, vomiting, vomiting blood, and yellowish skin color. GU:  Complains of nocturia and urinary frequency; denies discharge and dysuria. MS:  Complains of joint pain and low back pain; denies muscle aches and cramps; occas. Derm:  Denies dryness, itching, and rash. Neuro:  Complains of poor balance; denies numbness, tingling, and tremors; with getting up from dizziness.Marland Kitchen  Physical Exam  General:  Well-developed,well-nourished,in no acute distress; alert,appropriate and cooperative throughout examination Head:  Normocephalic and atraumatic without obvious abnormalities. No apparent alopecia or balding. Sinuses NT, slight touchiness to left max prominence. Eyes:  Conjunctiva clear bilaterally.  Ears:  External ear exam shows no significant lesions or deformities.  Otoscopic examination reveals clear canals, tympanic membranes are intact bilaterally without bulging, retraction, inflammation or discharge. Hearing is  grossly normal bilaterally. Nose:  External nasal examination shows no deformity or inflammation. Nasal mucosa are pink and moist without lesions or exudates. Mouth:  Oral mucosa and oropharynx without lesions or exudates.  Teeth in good repair. Neck:  No deformities, masses, or tenderness noted. Mildly tender to palpation laterally, not c/w ant or post cerv chains. No swelling or color changes noted. Chest Wall:  No deformities, masses, or tenderness noted. Breasts:  Not done. Lungs:  Normal respiratory effort, chest expands symmetrically. Lungs are clear to auscultation, no crackles or wheezes. Heart:  Normal rate and regular rhythm. S1 and S2 normal without gallop, murmur, click, rub or other extra sounds. Abdomen:  Bowel sounds positive,abdomen soft and non-tender without masses, organomegaly or hernias noted. Rectal:  Not done. Genitalia:  Not done. Msk:  No deformity or scoliosis noted of thoracic or lumbar spine.   Pulses:  R and L carotid,radial,femoral,dorsalis pedis and posterior tibial pulses are full and equal bilaterally Extremities:  No clubbing, cyanosis, edema, or deformity noted with normal full range of motion of all joints.   Neurologic:  No cranial nerve deficits noted. Station and gait are wide based, normal for age. Has mild unsteadiness with first getting up, clears quickly. Sensory, motor and coordinative functions  appear intact. Skin:  Intact without suspicious lesions or rashes, some AKs and SKs on the trunk. Cervical Nodes:  No lymphadenopathy noted Inguinal Nodes:  No significant adenopathy Psych:  Cognition and judgment appear intact. Alert and cooperative with normal attention span and concentration. No apparent delusions, illusions, hallucinations. Very pleasant lady with good eye contact.   Impression & Recommendations:  Problem # 1:  HEALTH MAINTENANCE EXAM (ICD-V70.0) Assessment Comment Only I have personally reviewed the Medicare Annual Wellness  questionnaire and have noted 1.   The patient's medical and social history 2.   Their use of alcohol, tobacco or illicit drugs 3.   Their current medications and supplements 4.   The patient's functional ability including ADL's, fall risks, home safety risks and hearing or visual             impairment. Has longtimew hearing impairment and has dizziness with first getting up. Encouraged her to wear support hose and get up slowly. 5.   Diet and physical activities 6.   Evidence for depression or mood disorders   Problem # 2:  RENAL INSUFFICIENCY (ICD-588.9) Slowly progressing. Would like pt to see Nephrology to assess. BP, sugar control  and fluid intake appear acceptable. Orders: Nephrology Referral (Nephro)  Problem # 3:  SYNCOPE AND COLLAPSE (ICD-780.2) Assessment: Unchanged None recently. Seems stable. She is very careful to take her time getting up, having watched her sister with the same problem.  Problem # 4:  HYPERTENSION (ICD-401.9) Assessment: Unchanged  Great response to curr tx...cont. The following medications were removed from the medication list:    Captopril 100 Mg Tabs (Captopril) ..... One tab by mouth three times a day Her updated medication list for this problem includes:    Norvasc 5 Mg Tabs (Amlodipine besylate) .Marland Kitchen... Take 1 tablet by mouth once a day    Maxzide 75-50 Mg Tabs (Triamterene-hctz) .Marland Kitchen... Take 1 tablet by mouth once a day    Propranolol Hcl 40 Mg Tabs (Propranolol hcl) .Marland Kitchen... 1 and 1/2 tablets by mouth twice a day    Captopril 50 Mg Tabs (Captopril) .Marland Kitchen... Take  one by mouth three times a day  BP today: 120/76 Prior BP: 138/68 (07/09/2010)  Labs Reviewed: K+: 4.9 (08/14/2010) Creat: : 2.0 (08/14/2010)   Chol: 176 (08/14/2010)   HDL: 51.90 (08/14/2010)   LDL: 100 (08/14/2010)   TG: 121.0 (08/14/2010)  Problem # 5:  HYPERLIPIDEMIA (ICD-272.4) LDL not quite at goal but just recently changed to Crestor. Will follow for now. Her updated medication list  for this problem includes:    Crestor 10 Mg Tabs (Rosuvastatin calcium) .Marland Kitchen... Take one tablet by mouth daily.  Labs Reviewed: SGOT: 18 (08/14/2010)   SGPT: 14 (08/14/2010)   HDL:51.90 (08/14/2010), 42.3 (11/14/2007)  LDL:100 (08/14/2010), DEL (11/14/2007)  Chol:176 (08/14/2010), 261 (11/14/2007)  Trig:121.0 (08/14/2010), 178 (11/14/2007)  Complete Medication List: 1)  Norvasc 5 Mg Tabs (Amlodipine besylate) .... Take 1 tablet by mouth once a day 2)  Maxzide 75-50 Mg Tabs (Triamterene-hctz) .... Take 1 tablet by mouth once a day 3)  Propranolol Hcl 40 Mg Tabs (Propranolol hcl) .Marland Kitchen.. 1 and 1/2 tablets by mouth twice a day 4)  Crestor 10 Mg Tabs (Rosuvastatin calcium) .... Take one tablet by mouth daily. 5)  Aspirin 81 Mg Tbec (Aspirin) .... Take one tablet by mouth as needed 6)  Captopril 50 Mg Tabs (Captopril) .... Take  one by mouth three times a day  Patient Instructions: 1)  Refer to Nephrology. 2)  RTC 6mos for recheck. 3)  Get Td and Pnemovax then. 4)  Discuss Zostavax then.   Orders Added: 1)  Nephrology Referral [Nephro] 2)  Est. Patient 65& > [40981]    Current Allergies (reviewed today): ! PCN

## 2010-09-04 NOTE — Letter (Signed)
Summary: Nature conservation officer [employer] Wellness Visit Questionnaire   [employer] Annual Wellness Visit Questionnaire   Imported By: Beau Fanny 08/20/2010 16:42:55  _____________________________________________________________________  External Attachment:    Type:   Image     Comment:   External Document

## 2010-09-16 ENCOUNTER — Encounter: Payer: Self-pay | Admitting: Family Medicine

## 2010-09-24 NOTE — Letter (Signed)
Summary: Dr Thedore Mins new patient evaluation  Dr Thedore Mins new patient evaluation   Imported By: Kassie Mends 09/16/2010 09:28:33  _____________________________________________________________________  External Attachment:    Type:   Image     Comment:   External Document

## 2010-12-16 NOTE — Assessment & Plan Note (Signed)
[facility] OFFICE NOTE   NAME:Maciolek, AZUCENA DART                   MRN:          [id number]  DATE:05/22/2008                            DOB:          [birth year]    Ms. Spielmann returns today for followup of her history of syncope and  hypertension.   Please see my previous notes for details of our previous evaluation.  She is to have carotid Dopplers today in the office.   She feels well.  She has had no further presyncope or syncope.   Her blood pressure is 138/70, her pulse is 60 and regular.  The rest of  exam is unchanged.   Ms. Marut is doing well on her current medical therapy.  We will obtain  carotid Dopplers today in the office give her preliminary report.  Assuming these are stable.  She is doing well.  I will see her back in a  year.     Thomas C. Daleen Squibb, MD, [facility]  Electronically Signed    TCW/MedQ  DD: 05/22/2008  DT: 05/22/2008  Job #: [id number]

## 2010-12-16 NOTE — Assessment & Plan Note (Signed)
[facility]                            [place] OFFICE NOTE   NAME:Angela Fuentes, Angela Fuentes                   MRN:          [id number]  DATE:12/15/2007                            DOB:          [birth year]    REP0RT TITLE:  CARDIOLOGY CONSULTATION   I was asked by Dr. Verdell Face to evaluate Angela Fuentes with  cardiomegaly, two episodes of sharp stabbing chest pain with sweating  and what she also describes as one syncopal event last summer that I do  not think she has told anybody about.   HISTORY OF PRESENT ILLNESS:  She is 77 years of age, married.  She takes  care of her husband and does most of the outdoor work, including mowing.  She mows with a push mower and also a riding mower.   Last summer, she was out mowing.  The next thing she knows, she finds  herself on the ground.  She did not have any antecedent symptoms.  She  was clear when she came around.  She felt well before that.  She  apparently did not tell anybody about it, and her husband did not  witness it.  She has had no further syncopal events.   She has had two episodes, one before Thanksgiving while walking historic  area of [place], [place].  She has sharp stabbing pain, felt  very weak and presyncopal.   She has had one other spell a couple weeks ago that was similar.   She denies any exertional chest discomfort or angina, though she does  have some dyspnea on exertion.  She denies any palpitations or any  syncope with exertion.   No orthopnea, PND or peripheral edema.   PAST MEDICAL HISTORY:  She has no dye allergies.   ALLERGIES:  SHE IS INTOLERANT TO PENICILLIN.   CURRENT MEDICATIONS:  1. Norvasc 5 mg a day.  2. Captopril 50 mg p.o. t.i.d.  3. Maxzide 75/50 daily.  4. Aspirin 81 mg a day.  5. Simvastatin 80 mg nightly.  6. Propranolol 60 mg p.o. b.i.d.  7. Soma p.r.n.   She does not drink, smoke or use any significant caffeine.   She has had cancer  in the past and she says they took everything out  except what she needed to live by.  This was of the uterus and ovarian  area.  Details unknown.   FAMILY HISTORY:  No premature history of coronary disease.   SOCIAL HISTORY:  She is retired.  She is married.  She does not have any  children.   REVIEW OF SYSTEMS:  She has a history of constipation and arthritis in  her knees.  Otherwise, the review of systems are negative.   PHYSICAL EXAMINATION:  VITAL SIGNS:  Today, her blood pressure is  152/78, her pulse is 64 and regular.  She is 5 feet 7.  HEENT:  Normocephalic, atraumatic.  PERRL.  Extraocular movements intact.  Sclerae are clear.  Face symmetry is normal.  Dentition satisfactory.  NECK:  Supple.  Carotid upstrokes are equal bilateral without bruits, no  JVD.  Thyroid  is not enlarged.  Trachea is midline.  LUNGS:  Clear  HEART:  Reveals a nondisplaced PMI.  She has a soft systolic murmur  along the left sternal border.  There is no gallop.  S2 splits  physiologically.  ABDOMEN:  Soft, good bowel sounds.  No midline bruit.  No hepatomegaly.  EXTREMITIES:  Reveal no cyanosis, clubbing or edema.  Pulses are present  bilaterally.  NEURO:  Intact.  There is no sign of DVT.  MUSCULOSKELETAL:  Shows chronic arthritic changes.   Her EKG is completely normal.   ASSESSMENT:  1. Dyspnea on exertion, chest pain, presyncope, rule out obstructive      coronary artery disease.  2. History of syncope without any significant warning or antecedent      symptoms.  This has not recurred since last summer.  We need to      rule out any significant structural heart disease, including      pulmonary hypertension or any significant left ventricular      hypertrophy or left ventricular dysfunction.  She does have      cardiomegaly on chest x-ray.   I have arranged for her to have a 2-D echocardiogram and an adenosine  Myoview.  Once obtained, will have her return for discussion and follow-   up.     Thomas C. Daleen Squibb, MD, [facility]  Electronically Signed    TCW/MedQ  DD: 12/15/2007  DT: 12/15/2007  Job #: [id number]   cc:   Arta Silence, MD

## 2010-12-16 NOTE — Assessment & Plan Note (Signed)
[facility] OFFICE NOTE   NAME:Cross, Angela Fuentes                   MRN:          [id number]  DATE:01/11/2008                            DOB:          September 17, 1920    Ms. [name] returns today for further management and follow up of her  dyspnea on exertion, syncope, and chest pain.   She had a 2D echocardiogram which showed mild left ventricular  hypertrophy, EF 60%.  She has some diastolic dysfunction.  She had a  mild-to-moderately calcified aortic valve with no significant stenosis.  Mean gradient was only 6 mmHg.  There was mild mitral regurgitation, and  her left atrium was mild-to-moderately dilated.  Her pulmonary pressures  were only mildly increased.   She also had an adenosine Myoview, which showed no ischemia, EF of 72%.  She has known carotid disease and looking back her carotids on November 09, 2006, showed some mild progression in the left internal carotid artery.  She had antegrade flow in both vertebrals.  She is due for Dopplers in  October.   She has had no further episodes.   She is on a good medical program, though her blood pressure has not been  optimal here in the office.  Medicines are the same as last visit.   Her blood pressure today was 144/73, and her pulse is 56, and regular.  The rest of her exam is unchanged.   At this point in time, I think good blood pressure control is in order.  I am not sure why she has the syncopal events.  I would increase her  Norvasc to 10 mg a day if she continues to be hypertensive.   We will arrange for her to have carotid Dopplers in October.     Thomas C. Daleen Squibb, MD, [facility]  Electronically Signed    TCW/MedQ  DD: 01/11/2008  DT: 01/12/2008  Job #: [id number]   cc:   Arta Silence, MD

## 2010-12-26 ENCOUNTER — Ambulatory Visit (INDEPENDENT_AMBULATORY_CARE_PROVIDER_SITE_OTHER): Payer: Medicare Other | Admitting: Cardiovascular Disease

## 2010-12-26 ENCOUNTER — Encounter: Payer: Self-pay | Admitting: Cardiovascular Disease

## 2010-12-26 DIAGNOSIS — N289 Disorder of kidney and ureter, unspecified: Secondary | ICD-10-CM

## 2010-12-26 DIAGNOSIS — H8309 Labyrinthitis, unspecified ear: Secondary | ICD-10-CM

## 2010-12-26 DIAGNOSIS — E785 Hyperlipidemia, unspecified: Secondary | ICD-10-CM

## 2010-12-26 DIAGNOSIS — Z8543 Personal history of malignant neoplasm of ovary: Secondary | ICD-10-CM

## 2010-12-26 DIAGNOSIS — K8020 Calculus of gallbladder without cholecystitis without obstruction: Secondary | ICD-10-CM

## 2010-12-26 DIAGNOSIS — N259 Disorder resulting from impaired renal tubular function, unspecified: Secondary | ICD-10-CM

## 2010-12-26 DIAGNOSIS — I6529 Occlusion and stenosis of unspecified carotid artery: Secondary | ICD-10-CM

## 2010-12-26 DIAGNOSIS — R55 Syncope and collapse: Secondary | ICD-10-CM

## 2010-12-26 DIAGNOSIS — R7309 Other abnormal glucose: Secondary | ICD-10-CM

## 2010-12-26 DIAGNOSIS — I10 Essential (primary) hypertension: Secondary | ICD-10-CM

## 2010-12-26 DIAGNOSIS — R079 Chest pain, unspecified: Secondary | ICD-10-CM

## 2010-12-26 MED ORDER — LISINOPRIL 20 MG PO TABS: 20.0000 mg | ORAL_TABLET | Freq: Every day | ORAL | Status: DC

## 2010-12-26 MED ORDER — ATENOLOL 25 MG PO TABS: 25.0000 mg | ORAL_TABLET | Freq: Every day | ORAL | Status: DC

## 2010-12-26 NOTE — Patient Instructions (Addendum)
You are doing well. Please stop the propronolol and stop the captopril Start lisinopril 20 mg daily Start atenolol 25 mg daily Please call us if you have new issues that need to be addressed before your next appt.  We will call you for a follow up Appt. In 12 months Follow up with Dr. Hetty Ely Your physician has requested that you regularly monitor and record your blood pressure readings at home. Please use the same machine at the same time of day to check your readings and record them to bring to your follow-up visit.

## 2010-12-26 NOTE — Assessment & Plan Note (Signed)
She does report a rare episode of dizziness. We will need to monitor her blood pressure closely. It sounds orthostatic in nature and quickly resolves.

## 2010-12-26 NOTE — Assessment & Plan Note (Signed)
She is not taking her medication as prescribed. She takes captopril once a day, propranolol once a day. We will change her to lisinopril 20 mg daily, atenolol 25 mg daily. We will stop her captopril and propranolol. Last her to monitor her blood pressure. She is on a blood pressure cuff though she is able to check her blood pressure at her pharmacy. He also asked her to follow up with Dr. Hetty Ely.

## 2010-12-26 NOTE — Assessment & Plan Note (Signed)
60-70% right internal carotid arterial disease, mild to moderate disease on the left. We need to continue aggressive cholesterol management.

## 2010-12-26 NOTE — Assessment & Plan Note (Signed)
Cholesterol has improved though still not at goal. We'll recommend that we go to Crestor 20 mg daily.

## 2010-12-26 NOTE — Progress Notes (Signed)
   Patient ID: Angela Fuentes, female    DOB: 05-22-1921, 77 y.o.   MRN: [id number]  HPI Comments: Angela Fuentes is a very pleasant 77 year old woman with a remote history of syncope, hyperlipidemia, peripheral vascular disease with bilateral carotid arterial disease that has been stable, hypertension who presents for routine followup.   She reports that she is doing well. She is active, has no complaints of chest pain or shortness of breath. She takes all of her medications in the morning and does not take captopril 3 times a day and does not take propranolol twice a day. She has not read the labels on the medications. She otherwise feels well and has no complaints.   EKG shows normal sinus rhythm with rate 66 beats per minute, no significant ST or T wave changes        Review of Systems  Constitutional: Negative.   HENT: Negative.   Eyes: Negative.   Respiratory: Negative.   Cardiovascular: Negative.   Gastrointestinal: Negative.   Musculoskeletal: Negative.   Skin: Negative.   Neurological: Negative.   Hematological: Negative.   Psychiatric/Behavioral: Negative.   All other systems reviewed and are negative.    BP 140/68  Pulse 65  Ht 5\' 7"  (1.702 m)  Wt 151 lb (68.493 kg)  BMI 23.65 kg/m2  Physical Exam  Nursing note and vitals reviewed. Constitutional: She is oriented to person, place, and time. She appears well-developed and well-nourished.  HENT:  Head: Normocephalic.  Nose: Nose normal.  Mouth/Throat: Oropharynx is clear and moist.  Eyes: Conjunctivae are normal. Pupils are equal, round, and reactive to light.  Neck: Normal range of motion. Neck supple. No JVD present.  Cardiovascular: Normal rate, regular rhythm, S1 normal, S2 normal, normal heart sounds and intact distal pulses.  Exam reveals no gallop and no friction rub.   No murmur heard. Pulmonary/Chest: Effort normal and breath sounds normal. No respiratory distress. She has no wheezes. She has no rales. She  exhibits no tenderness.  Abdominal: Soft. Bowel sounds are normal. She exhibits no distension. There is no tenderness.  Musculoskeletal: Normal range of motion. She exhibits no edema and no tenderness.  Lymphadenopathy:    She has no cervical adenopathy.  Neurological: She is alert and oriented to person, place, and time. Coordination normal.  Skin: Skin is warm and dry. No rash noted. No erythema.  Psychiatric: She has a normal mood and affect. Her behavior is normal. Judgment and thought content normal.         Assessment and Plan

## 2010-12-31 ENCOUNTER — Telehealth: Payer: Self-pay | Admitting: *Deleted

## 2010-12-31 MED ORDER — ROSUVASTATIN CALCIUM 20 MG PO TABS: 20.0000 mg | ORAL_TABLET | Freq: Every day | ORAL | Status: DC

## 2010-12-31 NOTE — Telephone Encounter (Signed)
Spoke to pt, after her last ov, Dr. Mariah Milling had wanted to incr her Crestor from 10mg  to 20mg  once daily. Notified pt of this and she will pick up new prescription.

## 2011-01-24 ENCOUNTER — Encounter: Payer: Self-pay | Admitting: Family Medicine

## 2011-01-27 ENCOUNTER — Encounter: Payer: Self-pay | Admitting: Family Medicine

## 2011-01-27 ENCOUNTER — Ambulatory Visit (INDEPENDENT_AMBULATORY_CARE_PROVIDER_SITE_OTHER): Payer: Medicare Other | Admitting: Family Medicine

## 2011-01-27 VITALS — BP 220/88 | HR 80 | Temp 98.3°F | Ht 67.0 in | Wt 159.5 lb

## 2011-01-27 DIAGNOSIS — M7989 Other specified soft tissue disorders: Secondary | ICD-10-CM

## 2011-01-27 DIAGNOSIS — N259 Disorder resulting from impaired renal tubular function, unspecified: Secondary | ICD-10-CM

## 2011-01-27 DIAGNOSIS — I10 Essential (primary) hypertension: Secondary | ICD-10-CM

## 2011-01-27 DIAGNOSIS — I6529 Occlusion and stenosis of unspecified carotid artery: Secondary | ICD-10-CM

## 2011-01-27 DIAGNOSIS — R0609 Other forms of dyspnea: Secondary | ICD-10-CM

## 2011-01-27 DIAGNOSIS — I160 Hypertensive urgency: Secondary | ICD-10-CM

## 2011-01-27 LAB — COMPREHENSIVE METABOLIC PANEL
Albumin: 4.3 g/dL (ref 3.5–5.2)
Alkaline Phosphatase: 62 U/L (ref 39–117)
BUN: 24 mg/dL — ABNORMAL HIGH (ref 6–23)
CO2: 26 mEq/L (ref 19–32)
Calcium: 9.1 mg/dL (ref 8.4–10.5)
Chloride: 109 mEq/L (ref 96–112)
GFR: 41.79 mL/min — ABNORMAL LOW (ref >60.00)
Glucose, Bld: 98 mg/dL (ref 70–99)
Potassium: 4.4 mEq/L (ref 3.5–5.1)
Sodium: 144 mEq/L (ref 135–145)
Total Protein: 6.7 g/dL (ref 6.0–8.3)

## 2011-01-27 LAB — CBC WITH DIFFERENTIAL/PLATELET
Basophils Absolute: 0 10*3/uL (ref 0.0–0.1)
HCT: 34.1 % — ABNORMAL LOW (ref 36.0–46.0)
Lymphs Abs: 2.3 10*3/uL (ref 0.7–4.0)
Monocytes Relative: 7.9 % (ref 3.0–12.0)
Neutrophils Relative %: 58.3 % (ref 43.0–77.0)
Platelets: 184 10*3/uL (ref 150.0–400.0)
RDW: 14 % (ref 11.5–14.6)

## 2011-01-27 LAB — TSH: TSH: 0.59 u[IU]/mL (ref 0.35–5.50)

## 2011-01-27 MED ORDER — AMLODIPINE BESYLATE 5 MG PO TABS: 5.0000 mg | ORAL_TABLET | Freq: Every day | ORAL | Status: DC

## 2011-01-27 MED ORDER — TRIAMTERENE-HCTZ 75-50 MG PO TABS: 1.0000 | ORAL_TABLET | Freq: Every day | ORAL | Status: DC

## 2011-01-27 NOTE — Assessment & Plan Note (Signed)
Likely due to noncompliance and confusion with meds and how to take them.   Only brings 4 meds today (supposed to be on 5 per med list). Cleaned up med list today, again emphasized how to take all meds (all are now on a daily basis since recent change by cards to simplify). Discussed reasons to treat elevated BP. Discussed reasons to go to ER given elevated BP. Concern for developing CHF given leg swelling as well as JVD, check BNP.  If elevated, consider starting lasix, consider checking echo. F/u 1 wk.

## 2011-01-27 NOTE — Assessment & Plan Note (Signed)
Near fall from tripping with scabs currently on legs. Given L>R swelling, there is some concern for clot - sent for ultrasound eval.  Also checking BNP for CHF as some evidence on exam.

## 2011-01-27 NOTE — Patient Instructions (Addendum)
I'm worried about your heart and blood clot in leg. Pass by up front to schedule ultrasound of left leg. Blood pressure medicines -  1. amlodipine 5mg  once daily. 2. Lisinopril 20mg  once daily. 3. Atenolol 25mg  once daily. 4. maxzide 75/50mg  once daily. Cholesterol medicine -  1. crestor 20mg  once daily Daily baby aspirin.   Blood work today. RETURN TO SEE [place] in 1 week for follow up.

## 2011-01-27 NOTE — Assessment & Plan Note (Addendum)
See above. Again reviewed meds and how to take them. F/u 1 wk when back on meds, anticipate better control of bps.

## 2011-01-27 NOTE — Assessment & Plan Note (Signed)
seen by Dr. Monte Fantasia, reviewed office note from 09/2010. Recheck Cr today.

## 2011-01-27 NOTE — Progress Notes (Signed)
Subjective:    Patient ID: Angela Fuentes, female    DOB: Dec 09, 1920, 77 y.o.   MRN: [id number]  HPI CC: fall, HTN  Brings bag of meds - only crestor, lisinopril and atenolol in bag.  Unsure overall what she takes.  Has run out of amlodipine, states ran out a few days ago but brings bottle that is dated 06/2009.  Tries to remember to take baby aspirin daily, but sometimes forgets.  DOI: 2 wks ago.  No fall, just near fall after tripping over steps to get into house.  Denies premonitory sxs.  Injury to leg happened 2 wks ago.  Tripped last Saturday - when turning to go into house up steps, hit left leg with door.  Did not fall, able to hang on to rail to prevent fall.  Noting swelling since then.  Also having right foot swelling but not as bad.  Swelling on left up to mid leg.  Denies headaches, SOB, CP/tightness, nausea, cough.  + occasional dizziness.  Otherwise reports feeling well.  Actually came in because sister and husband kept telling her to, concerned about blood clot.  Tries to avoid doctors "I don't want to bother you all"  Medications and allergies reviewed and updated in chart. Patient Active Problem List  Diagnoses  . HYPERLIPIDEMIA  . VERTIGO SECONDARY TO LABRYNTHITIS  . HYPERTENSION  . CAROTID ARTERY STENOSIS, BILATERAL  . CHOLELITHIASIS  . DISORDER, KIDNEY/URETER NOS  . SYNCOPE AND COLLAPSE  . CHEST PAIN  . HYPERGLYCEMIA  . HX, PERSONAL, MALIGNANCY, OVARY  . RENAL INSUFFICIENCY   Past Medical History  Diagnosis Date  . Syncope and collapse   . Occlusion and stenosis of carotid artery without mention of cerebral infarction   . Chest pain, unspecified   . Routine general medical examination at a health care facility   . Unspecified disorder of kidney and ureter   . Other abnormal glucose   . Cardiomegaly   . Calculus of gallbladder without mention of cholecystitis or obstruction   . Other and unspecified hyperlipidemia 11/01/1997  . Unspecified essential  hypertension 11/01/1997  . Personal history of malignant neoplasm of ovary   . DDD (degenerative disc disease) 12/2000    Cervical series /MRI neck multi level degen changes 05/02: cervical x-ray DDD 06/2003  . Labyrinthitis, unspecified   . Cancer 1960's    Ovarian cancer//hysterectomy   Past Surgical History  Procedure Date  . Back surgery     L5/S1 ( Dr. Ocie Bob)  . Vaginal hysterectomy 1960    ovarian cancer   . Cholecystectomy 08/03/1949  . Carotid ultrasound 05/22/2008    carotid ultrasound stable 60-79 RICA  0-39LICA stable   . Adenosine myoview 12/21/2007    Normal  . US echocardiography 02/19/2005    Echo EF 65% tr AR grossly normal   History  Substance Use Topics  . Smoking status: Never Smoker   . Smokeless tobacco: Not on file  . Alcohol Use: No   Family History  Problem Relation Age of Onset  . Hypertension Mother   . Stroke Mother 20    cva (hemm)  . Cancer Father     Prostate cancer  . COPD Brother     emphysema   Allergies  Allergen Reactions  . Penicillins     REACTION: whelps   Current Outpatient Prescriptions on File Prior to Visit  Medication Sig Dispense Refill  . aspirin 81 MG tablet Take 81 mg by mouth daily.        Marland Kitchen  atenolol (TENORMIN) 25 MG tablet Take 1 tablet (25 mg total) by mouth daily.  30 tablet  6  . lisinopril (PRINIVIL,ZESTRIL) 20 MG tablet Take 1 tablet (20 mg total) by mouth daily.  30 tablet  6  . rosuvastatin (CRESTOR) 20 MG tablet Take 1 tablet (20 mg total) by mouth daily.  30 tablet  6  . DISCONTD: amLODipine (NORVASC) 5 MG tablet Take 5 mg by mouth daily.        Marland Kitchen DISCONTD: captopril (CAPOTEN) 50 MG tablet Take 50 mg by mouth 3 (three) times daily.        Marland Kitchen DISCONTD: propranolol (INDERAL) 40 MG tablet 1 and 1/2 tablets by mouth twice a day       . DISCONTD: triamterene-hydrochlorothiazide (MAXZIDE) 75-50 MG per tablet Take 1 tablet by mouth daily.         Review of Systems Per HPI    Objective:   Physical Exam    Nursing note and vitals reviewed. Constitutional: She is oriented to person, place, and time. She appears well-developed and well-nourished. No distress.  HENT:  Head: Atraumatic.  Mouth/Throat: Oropharynx is clear and moist. No oropharyngeal exudate.  Eyes: Conjunctivae and EOM are normal. Pupils are equal, round, and reactive to light. No scleral icterus.  Neck: Normal range of motion. Neck supple. Hepatojugular reflux and JVD present. Carotid bruit is present (R sided).  Cardiovascular: Normal rate, regular rhythm, normal heart sounds and intact distal pulses.   No murmur heard. Pulmonary/Chest: Effort normal and breath sounds normal. No respiratory distress. She has no wheezes. She has no rales.  Abdominal: Soft. Bowel sounds are normal.  Musculoskeletal: She exhibits edema (1+ pitting edema R, 2+ pitting edema L).  Neurological: She is alert and oriented to person, place, and time.  Skin: Skin is warm and dry. No rash noted.  Psychiatric: She has a normal mood and affect.          Assessment & Plan:

## 2011-01-27 NOTE — Assessment & Plan Note (Signed)
R bruit.  Recently crestor increased to 20mg .  Denies SOB, CP, CVA sxs.

## 2011-01-28 ENCOUNTER — Telehealth: Payer: Self-pay

## 2011-01-28 NOTE — Telephone Encounter (Signed)
Message copied by Patience Musca on Wed Jan 28, 2011 12:25 PM ------      Message from: Eustaquio Boyden      Created: Tue Jan 27, 2011 10:35 PM       Please notify kidney function actually improved but still slightly elevated at 1.3.  Sugar normal.  Liver normal.  Thyroid normal.        Test measuring how heart is pumping returned elevated.  Ensure taking maxzide, should help with leg swelling as well.  Will re evaluate next week.

## 2011-01-28 NOTE — Telephone Encounter (Signed)
Patient notified as instructed by telephone. Pt said she would go for her appt about her leg on Fri and see Dr. Sharen Fuentes in the office on 02/03/11.

## 2011-01-30 ENCOUNTER — Encounter (INDEPENDENT_AMBULATORY_CARE_PROVIDER_SITE_OTHER): Payer: Medicare Other | Admitting: *Deleted

## 2011-01-30 DIAGNOSIS — M7989 Other specified soft tissue disorders: Secondary | ICD-10-CM

## 2011-02-02 ENCOUNTER — Encounter: Payer: Self-pay | Admitting: Family Medicine

## 2011-02-03 ENCOUNTER — Encounter: Payer: Self-pay | Admitting: Family Medicine

## 2011-02-03 ENCOUNTER — Ambulatory Visit (INDEPENDENT_AMBULATORY_CARE_PROVIDER_SITE_OTHER): Payer: Medicare Other | Admitting: Family Medicine

## 2011-02-03 VITALS — BP 146/80 | HR 76 | Temp 97.7°F | Wt 153.0 lb

## 2011-02-03 DIAGNOSIS — N259 Disorder resulting from impaired renal tubular function, unspecified: Secondary | ICD-10-CM

## 2011-02-03 DIAGNOSIS — M7989 Other specified soft tissue disorders: Secondary | ICD-10-CM

## 2011-02-03 DIAGNOSIS — I10 Essential (primary) hypertension: Secondary | ICD-10-CM

## 2011-02-03 MED ORDER — FUROSEMIDE 20 MG PO TABS: 10.0000 mg | ORAL_TABLET | Freq: Every day | ORAL | Status: DC

## 2011-02-03 MED ORDER — TRIAMTERENE-HCTZ 37.5-25 MG PO TABS: 1.0000 | ORAL_TABLET | Freq: Every day | ORAL | Status: DC

## 2011-02-03 NOTE — Assessment & Plan Note (Signed)
Discussed CKD for patient, currently stage 3. Cr actually improved compared to last check.

## 2011-02-03 NOTE — Assessment & Plan Note (Signed)
LLE doppler negative for DVT. Improved edema but still present. Start lasix, low dose.  rtc 2 wks for labwork, 1 mo for f/u.

## 2011-02-03 NOTE — Patient Instructions (Signed)
Cut yellow pill in half (triamterene hydrochlorothiazide).  When I send in prescription, new dose will be 1 whole pill. Start lasix 10mg  1/2 pill daily. Continue crestor, aspirin, lisinopril, atenolol and amlodipine (norvasc) as before. Good to see you today, return in 1 month for follow up. Return to see Dr. Hetty Ely in 1 month for follow up. Return in 2 weeks for lab visit.

## 2011-02-03 NOTE — Assessment & Plan Note (Addendum)
signficant improvement since last visit, with discussion of how to take meds as well as refilled maxzide. Given elevated BNP and continued concern for CHF on exam with JVD and periph edema (although improved), have started lasix low dose.   As starting lasix, decrease maxzide dose. Anticipate diastolic dysfunction.   F/u 1 mo with PCP.   If not improved, consider echo.  Did not order today given seemed stable, improving on exam with current treatment plan.

## 2011-02-03 NOTE — Progress Notes (Signed)
Subjective:    Patient ID: Angela Fuentes, female    DOB: 03-20-1921, 77 y.o.   MRN: [id number]  HPI CC: 1 wk f/u  Seen last week with swelling in legs as well as hypertension.  BP better, still swelling some but improved.  Feeling normal.  Brings meds showing maxzide, amlodipine, lisinopril and atenolol, reports compliance with meds.  Denies low BPs.  Denies HA, dizziness, LOC or presyncope, CP/tightness, SOB, nausea.  LLE doppler showing no blood clots.  Remaining scabs on lower legs, not tender.  Reviewed blood work in detail with patient.  Cr better at 1.3, BNP elevated at 616.  Thyroid, liver, CBC overall WNL (Hgb 11.6) Pt denies cough, SOB, states leg swelling improving on maxzide.  BP Readings from Last 3 Encounters:  02/03/11 146/80  01/27/11 220/88  12/26/10 140/68   Last echo 2006, EF 65%, grossly normal. Wt Readings from Last 3 Encounters:  02/03/11 153 lb 0.6 oz (69.418 kg)  01/27/11 159 lb 8 oz (72.349 kg)  12/26/10 151 lb (68.493 kg)   Medications and allergies reviewed and updated in chart. Patient Active Problem List  Diagnoses  . HYPERLIPIDEMIA  . VERTIGO SECONDARY TO LABRYNTHITIS  . HYPERTENSION  . CAROTID ARTERY STENOSIS, BILATERAL  . CHOLELITHIASIS  . DISORDER, KIDNEY/URETER NOS  . SYNCOPE AND COLLAPSE  . CHEST PAIN  . HYPERGLYCEMIA  . HX, PERSONAL, MALIGNANCY, OVARY  . RENAL INSUFFICIENCY  . Hypertensive urgency  . Leg swelling   Past Medical History  Diagnosis Date  . Syncope and collapse   . Occlusion and stenosis of carotid artery without mention of cerebral infarction   . Chest pain, unspecified   . Routine general medical examination at a health care facility   . Unspecified disorder of kidney and ureter   . Other abnormal glucose   . Cardiomegaly   . Calculus of gallbladder without mention of cholecystitis or obstruction   . Other and unspecified hyperlipidemia 11/01/1997  . Unspecified essential hypertension 11/01/1997  .  Personal history of malignant neoplasm of ovary   . DDD (degenerative disc disease) 12/2000    Cervical series /MRI neck multi level degen changes 05/02: cervical x-ray DDD 06/2003  . Labyrinthitis, unspecified   . Cancer 1960's    Ovarian cancer//hysterectomy   Past Surgical History  Procedure Date  . Back surgery     L5/S1 ( Dr. Ocie Bob)  . Vaginal hysterectomy 1960    ovarian cancer   . Cholecystectomy 08/03/1949  . Carotid ultrasound 05/22/2008    carotid ultrasound stable 60-79 RICA  0-39LICA stable   . Adenosine myoview 12/21/2007    Normal  . US echocardiography 02/19/2005    Echo EF 65% tr AR grossly normal   History  Substance Use Topics  . Smoking status: Never Smoker   . Smokeless tobacco: Not on file  . Alcohol Use: No   Family History  Problem Relation Age of Onset  . Hypertension Mother   . Stroke Mother 38    cva (hemm)  . Cancer Father     Prostate cancer  . COPD Brother     emphysema   Allergies  Allergen Reactions  . Penicillins     REACTION: whelps   Current Outpatient Prescriptions on File Prior to Visit  Medication Sig Dispense Refill  . amLODipine (NORVASC) 5 MG tablet Take 1 tablet (5 mg total) by mouth daily.  30 tablet  11  . aspirin 81 MG tablet Take 81 mg by mouth daily.        Marland Kitchen  atenolol (TENORMIN) 25 MG tablet Take 1 tablet (25 mg total) by mouth daily.  30 tablet  6  . lisinopril (PRINIVIL,ZESTRIL) 20 MG tablet Take 1 tablet (20 mg total) by mouth daily.  30 tablet  6  . rosuvastatin (CRESTOR) 20 MG tablet Take 1 tablet (20 mg total) by mouth daily.  30 tablet  6  . DISCONTD: triamterene-hydrochlorothiazide (MAXZIDE) 75-50 MG per tablet Take 1 tablet by mouth daily.  30 tablet  11   Review of Systems Per HPI    Objective:   Physical Exam  Nursing note and vitals reviewed. Constitutional: She is oriented to person, place, and time. She appears well-developed and well-nourished. No distress.  HENT:  Head: Atraumatic.    Mouth/Throat: Oropharynx is clear and moist. No oropharyngeal exudate.  Eyes: Conjunctivae and EOM are normal. Pupils are equal, round, and reactive to light. No scleral icterus.  Neck: Normal range of motion. Neck supple. Hepatojugular reflux and JVD present. Carotid bruit is present (R sided).  Cardiovascular: Normal rate, regular rhythm, normal heart sounds and intact distal pulses.   No murmur heard. Pulmonary/Chest: Effort normal and breath sounds normal. No respiratory distress. She has no wheezes. She has no rales.       No crackles  Abdominal: Soft. Bowel sounds are normal.  Musculoskeletal: She exhibits edema (mild pitting edema R, 1+ pitting edema L).  Neurological: She is alert and oriented to person, place, and time.  Skin: Skin is warm and dry. No rash noted.       Scabs x2 LLE, x1 RLE, no erythema or drainage  Psychiatric: She has a normal mood and affect.          Assessment & Plan:

## 2011-02-11 ENCOUNTER — Ambulatory Visit (INDEPENDENT_AMBULATORY_CARE_PROVIDER_SITE_OTHER): Payer: Medicare Other | Admitting: Family Medicine

## 2011-02-11 ENCOUNTER — Encounter: Payer: Self-pay | Admitting: Family Medicine

## 2011-02-11 ENCOUNTER — Ambulatory Visit: Payer: Self-pay | Admitting: Family Medicine

## 2011-02-11 DIAGNOSIS — N259 Disorder resulting from impaired renal tubular function, unspecified: Secondary | ICD-10-CM

## 2011-02-11 DIAGNOSIS — H8309 Labyrinthitis, unspecified ear: Secondary | ICD-10-CM

## 2011-02-11 DIAGNOSIS — I10 Essential (primary) hypertension: Secondary | ICD-10-CM

## 2011-02-11 DIAGNOSIS — I6529 Occlusion and stenosis of unspecified carotid artery: Secondary | ICD-10-CM

## 2011-02-11 DIAGNOSIS — M7989 Other specified soft tissue disorders: Secondary | ICD-10-CM

## 2011-02-11 DIAGNOSIS — R0609 Other forms of dyspnea: Secondary | ICD-10-CM

## 2011-02-11 LAB — BASIC METABOLIC PANEL
BUN: 48 mg/dL — ABNORMAL HIGH (ref 6–23)
Creatinine, Ser: 1.9 mg/dL — ABNORMAL HIGH (ref 0.4–1.2)
GFR: 26.33 mL/min — ABNORMAL LOW (ref >60.00)

## 2011-02-11 LAB — BRAIN NATRIURETIC PEPTIDE: Pro B Natriuretic peptide (BNP): 263 pg/mL — ABNORMAL HIGH (ref 0.0–100.0)

## 2011-02-11 NOTE — Assessment & Plan Note (Signed)
Appears improved. Will recheck today.

## 2011-02-11 NOTE — Assessment & Plan Note (Addendum)
Better but still slightly high. Will cont to follow. BP Readings from Last 3 Encounters:  02/11/11 140/74  02/03/11 146/80  01/27/11 220/88

## 2011-02-11 NOTE — Assessment & Plan Note (Signed)
She declines PT for risk of falls and sxs are fleeting with change of position. Will continue to follow.

## 2011-02-11 NOTE — Assessment & Plan Note (Signed)
Improved. No edema today. Cont curr meds. Discussed being seen if recurred. Do not think she needs Echo at this point.

## 2011-02-11 NOTE — Progress Notes (Signed)
  Subjective:    Patient ID: Angela Fuentes, female    DOB: [birth year], 77 y.o.   MRN: [id number]  HPI Pt here for followup after being seen by Dr Reece Agar twice for HTN out of control with LE edema and presumed CHF with some renal insufficiency. HE has reorganized meds somewhat with last change of tapering off Maxzide and going to Lasix. She seems to have tolerated all of this well.     Review of Systems  Constitutional: Negative for fever, chills, diaphoresis, activity change and fatigue.  HENT: Negative for ear pain, congestion, rhinorrhea and postnasal drip.   Eyes: Negative for redness.  Respiratory: Negative for cough, chest tightness, shortness of breath and wheezing.   Cardiovascular: Negative for chest pain.  Neurological: Positive for light-headedness. Headaches: Has occas fleetibng lightheadedness with initially getting up. Does not think she has significant risk for falling.       Objective:   Physical Exam  Constitutional: She is oriented to person, place, and time. She appears well-developed and well-nourished. No distress.  HENT:  Head: Normocephalic and atraumatic.  Right Ear: External ear normal.  Left Ear: External ear normal.  Nose: Nose normal.  Mouth/Throat: Oropharynx is clear and moist. No oropharyngeal exudate.  Eyes: Conjunctivae and EOM are normal. Pupils are equal, round, and reactive to light.  Neck: Normal range of motion. Neck supple. No thyromegaly present.  Cardiovascular: Normal rate, regular rhythm and normal heart sounds.   Pulmonary/Chest: Effort normal and breath sounds normal. She has no wheezes. She has no rales.  Musculoskeletal: Normal range of motion. She exhibits no edema and no tenderness.       Legs healing with prolonged scab formation on two spots of lower left leg.  Lymphadenopathy:    She has no cervical adenopathy.  Neurological: She is alert and oriented to person, place, and time. No cranial nerve deficit. She exhibits normal muscle  tone. Coordination (slow but not abnml.) abnormal.  Skin: She is not diaphoretic.          Assessment & Plan:

## 2011-02-11 NOTE — Patient Instructions (Signed)
Will report labs on phone tree.

## 2011-02-11 NOTE — Assessment & Plan Note (Signed)
Does not appear active at this point. Will cont to follow.

## 2011-03-06 ENCOUNTER — Ambulatory Visit (INDEPENDENT_AMBULATORY_CARE_PROVIDER_SITE_OTHER): Payer: Medicare Other | Admitting: Family Medicine

## 2011-03-06 ENCOUNTER — Telehealth: Payer: Self-pay | Admitting: Family Medicine

## 2011-03-06 ENCOUNTER — Encounter: Payer: Self-pay | Admitting: Family Medicine

## 2011-03-06 ENCOUNTER — Ambulatory Visit: Payer: Medicare Other | Admitting: Family Medicine

## 2011-03-06 VITALS — BP 142/64 | HR 64 | Temp 97.9°F | Wt 146.5 lb

## 2011-03-06 DIAGNOSIS — E875 Hyperkalemia: Secondary | ICD-10-CM

## 2011-03-06 DIAGNOSIS — M7989 Other specified soft tissue disorders: Secondary | ICD-10-CM

## 2011-03-06 DIAGNOSIS — I10 Essential (primary) hypertension: Secondary | ICD-10-CM

## 2011-03-06 LAB — BASIC METABOLIC PANEL
Calcium: 9 mg/dL (ref 8.4–10.5)
Creatinine, Ser: 2.8 mg/dL — ABNORMAL HIGH (ref 0.4–1.2)
GFR: 16.72 mL/min — ABNORMAL LOW (ref >60.00)
Sodium: 141 mEq/L (ref 135–145)

## 2011-03-06 NOTE — Telephone Encounter (Signed)
Attempted to call patient. No answer no machine. Will try again tomorrow at Saturday clinic.

## 2011-03-06 NOTE — Progress Notes (Signed)
  Subjective:    Patient ID: Angela Fuentes, female    DOB: 1921/04/21, 77 y.o.   MRN: [id number]  HPI CC: f/u HTN  Here for f/u HTN - No vision changes, CP/tightness, SOB, leg swelling.  Endorses occasional HA, no change recently.  Endorses occasional dizziness, longstanding.  States feeling well overall.  Last visit found to have bump in Cr again.  Wt Readings from Last 3 Encounters:  03/06/11 146 lb 8 oz (66.452 kg)  02/11/11 148 lb 8 oz (67.359 kg)  02/03/11 153 lb 0.6 oz (69.418 kg)   Review of Systems Per HPI    Objective:   Physical Exam  Nursing note and vitals reviewed. Constitutional: She appears well-developed and well-nourished. No distress.  HENT:  Head: Normocephalic and atraumatic.  Mouth/Throat: Oropharynx is clear and moist. No oropharyngeal exudate.  Eyes: Conjunctivae and EOM are normal. Pupils are equal, round, and reactive to light. No scleral icterus.  Neck: Normal range of motion. Neck supple. Carotid bruit is not present.  Cardiovascular: Normal rate, regular rhythm, normal heart sounds and intact distal pulses.   No murmur heard. Pulmonary/Chest: Effort normal and breath sounds normal. No respiratory distress. She has no wheezes. She has no rales.  Musculoskeletal: She exhibits tenderness. She exhibits no edema.  Lymphadenopathy:    She has no cervical adenopathy.  Skin: Skin is warm and dry. No rash noted.  Psychiatric: She has a normal mood and affect.          Assessment & Plan:

## 2011-03-06 NOTE — Assessment & Plan Note (Signed)
BP stable. Compliant with meds. Recommend continue. Check Cr today.  If staying elevated, stop lasix.  O/w continue meds.

## 2011-03-06 NOTE — Telephone Encounter (Signed)
Please notify kidney function has deteriorated.  Potassium also elevated. Have her stop lasix.  Very important to push fluids over weekend. if at any point feeling worse (nausea, chest pain, shortness of breath), to go to ER for evaluation. Return Tuesday next week for blood draw to follow kidneys and potassium.

## 2011-03-06 NOTE — Patient Instructions (Signed)
Blood pressure looking ok today. Checked blood work today. Good to see you today.  Call us with questions.

## 2011-03-07 NOTE — Telephone Encounter (Signed)
Called pt, no answer.  Please call Monday.

## 2011-03-07 NOTE — Telephone Encounter (Signed)
Able to speak with patient. Advised to hold lasix and push plenty of fluid over weekend. Kim please call on Monday to schedule Tuesday lab visit to follow potassium and kidneys.

## 2011-03-07 NOTE — Telephone Encounter (Signed)
No answer no machine. Will try again later. I don't know if you want to continue to reach her over the weekend or not. If not, I will call her again Monday

## 2011-03-09 NOTE — Telephone Encounter (Signed)
Spoke with patient and lab appointment scheduled.  

## 2011-03-10 ENCOUNTER — Telehealth: Payer: Self-pay | Admitting: Family Medicine

## 2011-03-10 ENCOUNTER — Other Ambulatory Visit (INDEPENDENT_AMBULATORY_CARE_PROVIDER_SITE_OTHER): Payer: Medicare Other | Admitting: Family Medicine

## 2011-03-10 DIAGNOSIS — E875 Hyperkalemia: Secondary | ICD-10-CM

## 2011-03-10 DIAGNOSIS — N259 Disorder resulting from impaired renal tubular function, unspecified: Secondary | ICD-10-CM

## 2011-03-10 DIAGNOSIS — I10 Essential (primary) hypertension: Secondary | ICD-10-CM

## 2011-03-10 LAB — BASIC METABOLIC PANEL
BUN: 63 mg/dL — ABNORMAL HIGH (ref 6–23)
GFR: 21.79 mL/min — ABNORMAL LOW (ref >60.00)
Glucose, Bld: 99 mg/dL (ref 70–99)
Potassium: 5 mEq/L (ref 3.5–5.1)

## 2011-03-10 NOTE — Telephone Encounter (Signed)
Please notify kidney function some better, still worse than normal.  Recommend make sure she is staying well hydrated with 8 8oz cups water or fluid daily. Recheck blood work in 3 wks for f/u.

## 2011-03-10 NOTE — Telephone Encounter (Signed)
Patient notified. She will increase fluid intake. Lab appointment scheduled for 3 weeks.

## 2011-03-30 ENCOUNTER — Telehealth: Payer: Self-pay | Admitting: Family Medicine

## 2011-03-30 ENCOUNTER — Encounter: Payer: Self-pay | Admitting: Family Medicine

## 2011-03-30 ENCOUNTER — Other Ambulatory Visit (INDEPENDENT_AMBULATORY_CARE_PROVIDER_SITE_OTHER): Payer: Medicare Other | Admitting: Family Medicine

## 2011-03-30 DIAGNOSIS — N259 Disorder resulting from impaired renal tubular function, unspecified: Secondary | ICD-10-CM

## 2011-03-30 LAB — BASIC METABOLIC PANEL
CO2: 23 mEq/L (ref 19–32)
Calcium: 9 mg/dL (ref 8.4–10.5)
Creatinine, Ser: 1.9 mg/dL — ABNORMAL HIGH (ref 0.4–1.2)
Glucose, Bld: 87 mg/dL (ref 70–99)

## 2011-03-30 NOTE — Telephone Encounter (Signed)
Attempted to call patient. No answer-no machine. Will try again later.

## 2011-03-30 NOTE — Telephone Encounter (Signed)
Please notify kidney function slightly improved but remains poor. Pt has continued kidney insufficiency. Would like her to come in next month for office visit to recheck kidneys and blood pressure. Ensure she stays away from anti inflammatores like ibuprofen, alleve, motrin, advil.  Tylenol ok.

## 2011-03-31 NOTE — Telephone Encounter (Signed)
Patient notified and follow up scheduled

## 2011-04-29 ENCOUNTER — Ambulatory Visit (INDEPENDENT_AMBULATORY_CARE_PROVIDER_SITE_OTHER): Payer: Medicare Other | Admitting: Family Medicine

## 2011-04-29 ENCOUNTER — Encounter: Payer: Self-pay | Admitting: Family Medicine

## 2011-04-29 VITALS — BP 132/86 | HR 60 | Temp 98.2°F | Wt 145.2 lb

## 2011-04-29 DIAGNOSIS — E785 Hyperlipidemia, unspecified: Secondary | ICD-10-CM

## 2011-04-29 DIAGNOSIS — I10 Essential (primary) hypertension: Secondary | ICD-10-CM

## 2011-04-29 DIAGNOSIS — N259 Disorder resulting from impaired renal tubular function, unspecified: Secondary | ICD-10-CM

## 2011-04-29 DIAGNOSIS — Z23 Encounter for immunization: Secondary | ICD-10-CM

## 2011-04-29 LAB — RENAL FUNCTION PANEL
Albumin: 4.2 g/dL (ref 3.5–5.2)
BUN: 45 mg/dL — ABNORMAL HIGH (ref 6–23)
Glucose, Bld: 91 mg/dL (ref 70–99)
Phosphorus: 4.8 mg/dL — ABNORMAL HIGH (ref 2.3–4.6)
Potassium: 5 mEq/L (ref 3.5–5.1)

## 2011-04-29 MED ORDER — ROSUVASTATIN CALCIUM 20 MG PO TABS: 20.0000 mg | ORAL_TABLET | Freq: Every day | ORAL | Status: DC

## 2011-04-29 MED ORDER — LISINOPRIL 20 MG PO TABS: 20.0000 mg | ORAL_TABLET | Freq: Every day | ORAL | Status: DC

## 2011-04-29 MED ORDER — AMLODIPINE BESYLATE 5 MG PO TABS: 5.0000 mg | ORAL_TABLET | Freq: Every day | ORAL | Status: DC

## 2011-04-29 MED ORDER — ATENOLOL 25 MG PO TABS: 25.0000 mg | ORAL_TABLET | Freq: Every day | ORAL | Status: DC

## 2011-04-29 NOTE — Progress Notes (Signed)
  Subjective:    Patient ID: Angela Fuentes, female    DOB: September 15, 1920, 77 y.o.   MRN: [id number]  HPI CC: f/u HTN  1. HTN - No HA, vision changes, CP/tightness, SOB, leg swelling.  Compliant with meds x3.  Requests refill.  2. HLD - crestor, no myalgias.  3. CRI - baseline Cr seems to be around 1.9.  Previously elevated up to 2.3.  No urinary changes.  Knees tend to bother her - takes alleve rarely.  Doesn't bother her too much.  Lost partial teeth 3 mo ago.  Has not had chance to go to [place] to get new dentures.  Review of Systems Per HPI    Objective:   Physical Exam  Nursing note and vitals reviewed. Constitutional: She appears well-developed and well-nourished. No distress.  HENT:  Head: Normocephalic and atraumatic.  Mouth/Throat: Oropharynx is clear and moist. No oropharyngeal exudate.  Eyes: Conjunctivae and EOM are normal. Pupils are equal, round, and reactive to light. No scleral icterus.  Neck: Normal range of motion. Neck supple. No thyromegaly present.  Cardiovascular: Normal rate, regular rhythm, normal heart sounds and intact distal pulses.   No murmur heard. Pulmonary/Chest: Effort normal and breath sounds normal. No respiratory distress. She has no wheezes. She has no rales.  Musculoskeletal: She exhibits no edema.  Lymphadenopathy:    She has no cervical adenopathy.  Skin: Skin is warm and dry. No rash noted.  Psychiatric: She has a normal mood and affect.          Assessment & Plan:

## 2011-04-29 NOTE — Assessment & Plan Note (Signed)
Chronic, stable. rec take crestor at night time.

## 2011-04-29 NOTE — Assessment & Plan Note (Signed)
BP Readings from Last 3 Encounters:  04/29/11 132/86  03/06/11 142/64  02/11/11 140/74  Best control to date.  Chronic, stable.  With CRI, goal <130/80.  May consider increase in amlodipine next visit.

## 2011-04-29 NOTE — Patient Instructions (Addendum)
Crestor - try to take at night because it will work better to lower cholesterol. I'd rather you take tylenol instead of alleve for knee pain (better on the kidneys). You have some kidney insufficiency - I do want [place] to keep an eye on this.  Blood work today to check on this. I've refilled all your medicines with 3 month supply at a time. Return in 6 months for follow up (assuming blood work returns ok today).

## 2011-04-29 NOTE — Assessment & Plan Note (Signed)
Discussed dx chronic renal insufficiency, advised importance to avoid NSAIDs, stay well hydrated. Check Renal panel today. Last check CKD stage 4.

## 2011-04-29 NOTE — Progress Notes (Signed)
Addended by: Patience Musca on: 04/29/2011 03:06 PM   Modules accepted: Orders

## 2011-04-30 ENCOUNTER — Ambulatory Visit: Payer: Medicare Other | Admitting: Family Medicine

## 2011-05-14 ENCOUNTER — Other Ambulatory Visit: Payer: Self-pay | Admitting: Cardiology

## 2011-05-14 DIAGNOSIS — I6529 Occlusion and stenosis of unspecified carotid artery: Secondary | ICD-10-CM

## 2011-05-15 ENCOUNTER — Encounter (INDEPENDENT_AMBULATORY_CARE_PROVIDER_SITE_OTHER): Payer: Medicare Other | Admitting: *Deleted

## 2011-05-15 DIAGNOSIS — I6529 Occlusion and stenosis of unspecified carotid artery: Secondary | ICD-10-CM

## 2011-06-01 ENCOUNTER — Other Ambulatory Visit: Payer: Self-pay | Admitting: *Deleted

## 2011-06-01 NOTE — Telephone Encounter (Signed)
Received refill request for Lasix. Not on med list and it looks like you stopped it it August. Could you confirm that please?

## 2011-06-01 NOTE — Telephone Encounter (Signed)
Pharmacy notified.

## 2011-06-01 NOTE — Telephone Encounter (Signed)
Yes this was discontinued.  Thanks.

## 2011-06-12 ENCOUNTER — Encounter: Payer: Self-pay | Admitting: Family Medicine

## 2011-06-12 ENCOUNTER — Ambulatory Visit (INDEPENDENT_AMBULATORY_CARE_PROVIDER_SITE_OTHER): Payer: Medicare Other | Admitting: Family Medicine

## 2011-06-12 DIAGNOSIS — I10 Essential (primary) hypertension: Secondary | ICD-10-CM

## 2011-06-12 DIAGNOSIS — N259 Disorder resulting from impaired renal tubular function, unspecified: Secondary | ICD-10-CM

## 2011-06-12 NOTE — Assessment & Plan Note (Signed)
Chronic. Improved. Compliant with meds. Has been taking lasix although asked to stop 03/2011 2/2 bump in Cr. Will stop for now and reassess next visit.  Hopeful to improve dizziness off lasix.

## 2011-06-12 NOTE — Patient Instructions (Addendum)
Stop lasix and we will see if dizziness gets any better. If worsening in the meantime, let me know. Return after 08/21/2011 for a physical. No other changes to our medicines today.

## 2011-06-12 NOTE — Assessment & Plan Note (Signed)
Pt states saw renal this year, no records in our system. Will request records and review.

## 2011-06-12 NOTE — Progress Notes (Signed)
  Subjective:    Patient ID: Angela Fuentes, female    DOB: 05-07-1921, 77 y.o.   MRN: [id number]  HPI CC: 50mo f/u HTN, elevated Cr  1. HTN - brings all meds.  No HA, vision changes, CP/tightness, SOB, leg swelling. Requests refill.  Doesn't check bp at home.  Occasional dizziness, lightheadedness.  2. HLD - crestor, no myalgias.   3. CRI - baseline Cr seems to be around 1.9. Previously elevated up to 2.3. No urinary changes.  Pt states had nephrology eval and told normal.  Lab Results  Component Value Date   CREATININE 2.0* 04/29/2011   BP Readings from Last 3 Encounters:  06/12/11 136/70  04/29/11 132/86  03/06/11 142/64   Wt Readings from Last 3 Encounters:  06/12/11 143 lb 1.9 oz (64.919 kg)  04/29/11 145 lb 4 oz (65.885 kg)  03/06/11 146 lb 8 oz (66.452 kg)  has been losing weight, states trying. No fevers/chills, night sweats, back pain, joint pain, nausea, HA. No recent physical.  Review of Systems Per HPI    Objective:   Physical Exam  Nursing note and vitals reviewed. Constitutional: She appears well-developed and well-nourished. No distress.  HENT:  Head: Normocephalic and atraumatic.  Mouth/Throat: Oropharynx is clear and moist. No oropharyngeal exudate.  Eyes: Conjunctivae and EOM are normal. Pupils are equal, round, and reactive to light. No scleral icterus.  Neck: Normal range of motion. Neck supple. Carotid bruit is not present.  Cardiovascular: Normal rate, regular rhythm, normal heart sounds and intact distal pulses.   No murmur heard. Pulses:      Radial pulses are 2+ on the right side, and 2+ on the left side.       Dorsalis pedis pulses are 2+ on the right side, and 2+ on the left side.  Pulmonary/Chest: Effort normal and breath sounds normal. No respiratory distress. She has no wheezes. She has no rales.  Lymphadenopathy:    She has no cervical adenopathy.  Skin: Skin is warm and dry. No rash noted.      Assessment & Plan:

## 2011-06-15 ENCOUNTER — Encounter: Payer: Self-pay | Admitting: Family Medicine

## 2011-10-01 ENCOUNTER — Ambulatory Visit (INDEPENDENT_AMBULATORY_CARE_PROVIDER_SITE_OTHER): Payer: Medicare Other | Admitting: Family Medicine

## 2011-10-01 ENCOUNTER — Encounter: Payer: Self-pay | Admitting: Family Medicine

## 2011-10-01 DIAGNOSIS — I10 Essential (primary) hypertension: Secondary | ICD-10-CM

## 2011-10-01 DIAGNOSIS — N259 Disorder resulting from impaired renal tubular function, unspecified: Secondary | ICD-10-CM

## 2011-10-01 MED ORDER — AMLODIPINE BESYLATE 10 MG PO TABS: 10.0000 mg | ORAL_TABLET | Freq: Every day | ORAL | Status: DC

## 2011-10-01 NOTE — Assessment & Plan Note (Addendum)
Lab Results  Component Value Date   CREATININE 2.0* 04/29/2011  reviewed nephrology eval 09/2010 by Dr. Cherylann Ratel, no further w/u recommended. Pt knows to stay hydrated and avoid NSAIDs.

## 2011-10-01 NOTE — Progress Notes (Signed)
  Subjective:    Patient ID: Angela Fuentes, female    DOB: 06-06-1921, 78 y.o.   MRN: [id number]  HPI CC: f/u HTN   HTN - No HA, vision changes, CP/tightness, SOB, leg swelling.  Reports compliance with meds.  Stopped lasix last visit 2/2 bump in Cr.  Intermittent dizziness.  No syncope.  H/o carotid stenosis  CRI - stage 4 chronic kidney disease.  Stays hydrated.  Avoids NSAIDs.  tylenol ok.  Wt Readings from Last 3 Encounters:  10/01/11 146 lb 4 oz (66.339 kg)  06/12/11 143 lb 1.9 oz (64.919 kg)  04/29/11 145 lb 4 oz (65.885 kg)  weight stable.  No falls in last year. Denies depression, anhedonia.  Last CPE 08/2010 with Dr. Hetty Ely.  Due for f/u with Dr. Mariah Milling later this year.  Review of Systems Per HPI    Objective:   Physical Exam  Nursing note and vitals reviewed. Constitutional: She appears well-developed and well-nourished. No distress.  HENT:  Head: Normocephalic and atraumatic.  Mouth/Throat: Oropharynx is clear and moist. No oropharyngeal exudate.  Eyes: Conjunctivae and EOM are normal. Pupils are equal, round, and reactive to light. No scleral icterus.  Neck: Normal range of motion. Neck supple. Carotid bruit is not present.  Cardiovascular: Normal rate, regular rhythm and intact distal pulses.   Murmur (2/6 SEM best at LUSB) heard. Pulmonary/Chest: Effort normal and breath sounds normal. No respiratory distress. She has no wheezes. She has no rales.  Musculoskeletal: She exhibits no edema.  Lymphadenopathy:    She has no cervical adenopathy.      Assessment & Plan:

## 2011-10-01 NOTE — Patient Instructions (Signed)
Good to see you today, call us with questions. Increase norvasc to 10mg  daily (you were taking 5mg ). Take 2 pills until you run out - new prescription will be for 1 pill a day (10mg ) Return in 3-4 months for medicare wellness check, come in a few days prior if you can fasting for blood work.

## 2011-10-01 NOTE — Assessment & Plan Note (Signed)
Chronic, deteriorated. Last visit d/c lasix 2/2 bump in Cr. Increase amlodipine today, recheck next visit.

## 2012-01-19 ENCOUNTER — Encounter: Payer: Self-pay | Admitting: Family Medicine

## 2012-01-19 ENCOUNTER — Other Ambulatory Visit: Payer: Self-pay | Admitting: Family Medicine

## 2012-01-19 DIAGNOSIS — E785 Hyperlipidemia, unspecified: Secondary | ICD-10-CM

## 2012-01-19 DIAGNOSIS — N184 Chronic kidney disease, stage 4 (severe): Secondary | ICD-10-CM

## 2012-01-19 DIAGNOSIS — E559 Vitamin D deficiency, unspecified: Secondary | ICD-10-CM

## 2012-01-19 DIAGNOSIS — I10 Essential (primary) hypertension: Secondary | ICD-10-CM

## 2012-01-22 ENCOUNTER — Other Ambulatory Visit (INDEPENDENT_AMBULATORY_CARE_PROVIDER_SITE_OTHER): Payer: Medicare Other

## 2012-01-22 DIAGNOSIS — I10 Essential (primary) hypertension: Secondary | ICD-10-CM

## 2012-01-22 DIAGNOSIS — E559 Vitamin D deficiency, unspecified: Secondary | ICD-10-CM

## 2012-01-22 DIAGNOSIS — E785 Hyperlipidemia, unspecified: Secondary | ICD-10-CM

## 2012-01-22 DIAGNOSIS — N184 Chronic kidney disease, stage 4 (severe): Secondary | ICD-10-CM

## 2012-01-22 LAB — MICROALBUMIN / CREATININE URINE RATIO
Microalb Creat Ratio: 2.1 mg/g (ref 0.0–30.0)
Microalb, Ur: 2.9 mg/dL — ABNORMAL HIGH (ref 0.0–1.9)

## 2012-01-22 LAB — COMPREHENSIVE METABOLIC PANEL
Albumin: 4.1 g/dL (ref 3.5–5.2)
CO2: 24 mEq/L (ref 19–32)
Chloride: 109 mEq/L (ref 96–112)
GFR: 27.6 mL/min — ABNORMAL LOW (ref >60.00)
Glucose, Bld: 102 mg/dL — ABNORMAL HIGH (ref 70–99)
Potassium: 4.6 mEq/L (ref 3.5–5.1)
Sodium: 141 mEq/L (ref 135–145)
Total Protein: 6.9 g/dL (ref 6.0–8.3)

## 2012-01-22 LAB — CBC WITH DIFFERENTIAL/PLATELET
Basophils Absolute: 0 10*3/uL (ref 0.0–0.1)
Eosinophils Absolute: 0.1 10*3/uL (ref 0.0–0.7)
Lymphocytes Relative: 32.8 % (ref 12.0–46.0)
Lymphs Abs: 1.6 10*3/uL (ref 0.7–4.0)
MCHC: 32.6 g/dL (ref 30.0–36.0)
Monocytes Relative: 9.4 % (ref 3.0–12.0)
Platelets: 157 10*3/uL (ref 150.0–400.0)
RDW: 14 % (ref 11.5–14.6)

## 2012-01-22 LAB — TSH: TSH: 0.94 u[IU]/mL (ref 0.35–5.50)

## 2012-01-23 LAB — VITAMIN D 25 HYDROXY (VIT D DEFICIENCY, FRACTURES): Vit D, 25-Hydroxy: 10 ng/mL — ABNORMAL LOW (ref 30–89)

## 2012-01-29 ENCOUNTER — Ambulatory Visit (INDEPENDENT_AMBULATORY_CARE_PROVIDER_SITE_OTHER): Payer: Medicare Other | Admitting: Family Medicine

## 2012-01-29 ENCOUNTER — Encounter: Payer: Self-pay | Admitting: Family Medicine

## 2012-01-29 VITALS — BP 147/77 | HR 100 | Temp 97.7°F | Ht 68.0 in | Wt 148.2 lb

## 2012-01-29 DIAGNOSIS — F0391 Unspecified dementia with behavioral disturbance: Secondary | ICD-10-CM | POA: Insufficient documentation

## 2012-01-29 DIAGNOSIS — R413 Other amnesia: Secondary | ICD-10-CM

## 2012-01-29 DIAGNOSIS — E785 Hyperlipidemia, unspecified: Secondary | ICD-10-CM

## 2012-01-29 DIAGNOSIS — Z23 Encounter for immunization: Secondary | ICD-10-CM

## 2012-01-29 DIAGNOSIS — H9190 Unspecified hearing loss, unspecified ear: Secondary | ICD-10-CM | POA: Insufficient documentation

## 2012-01-29 DIAGNOSIS — Z0000 Encounter for general adult medical examination without abnormal findings: Secondary | ICD-10-CM

## 2012-01-29 DIAGNOSIS — R7309 Other abnormal glucose: Secondary | ICD-10-CM

## 2012-01-29 DIAGNOSIS — N184 Chronic kidney disease, stage 4 (severe): Secondary | ICD-10-CM

## 2012-01-29 DIAGNOSIS — I6529 Occlusion and stenosis of unspecified carotid artery: Secondary | ICD-10-CM

## 2012-01-29 DIAGNOSIS — E559 Vitamin D deficiency, unspecified: Secondary | ICD-10-CM

## 2012-01-29 DIAGNOSIS — I10 Essential (primary) hypertension: Secondary | ICD-10-CM

## 2012-01-29 DIAGNOSIS — H6120 Impacted cerumen, unspecified ear: Secondary | ICD-10-CM

## 2012-01-29 MED ORDER — ERGOCALCIFEROL 1.25 MG (50000 UT) PO CAPS: 50000.0000 [IU] | ORAL_CAPSULE | ORAL | Status: DC

## 2012-01-29 NOTE — Assessment & Plan Note (Addendum)
I have personally reviewed the Medicare Annual Wellness questionnaire and have noted 1. The patient's medical and social history 2. Their use of alcohol, tobacco or illicit drugs 3. Their current medications and supplements 4. The patient's functional ability including ADL's, fall risks, home safety risks and hearing or visual impairment. 5. Diet and physical activity 6. Evidence for depression or mood disorders The patients weight, height, BMI have been recorded in the chart.  Hearing and vision has been addressed. I have made referrals, counseling and provided education to the patient based review of the above and I have provided the pt with a written personalized care plan for preventive services. See scanned questionairre. Advanced directives discussed:provided with packet of information.  Discussed HCPOA - see HPI  Reviewed preventative protocols and updated unless pt declined. Reviewed blood work in detail. Passes vision screen. Failed hearing screen - recommended audiology referral for hearing screen. Pt states will think about this. Some concern with memory impairment - will do MMSE at f/u. Asked her to return in 1 mo to further discuss, will likely recommend stop driving, asked her to bring husband to appointment.

## 2012-01-29 NOTE — Assessment & Plan Note (Signed)
Great control on crestor, continue meds. Lipid Panel     Component Value Date/Time   CHOL 146 01/22/2012 0913   TRIG 103.0 01/22/2012 0913   HDL 62.80 01/22/2012 0913   CHOLHDL 2 01/22/2012 0913   VLDL 20.6 01/22/2012 0913   LDLCALC 63 01/22/2012 0913

## 2012-01-29 NOTE — Assessment & Plan Note (Signed)
Known stage IV CKD. Discussed importance of staying hydrated.

## 2012-01-29 NOTE — Assessment & Plan Note (Signed)
With mild secondary hyperparathyroidism likely from CRI.  Recommended start weekly vit D supplement for next 3 months, will recheck then.

## 2012-01-29 NOTE — Patient Instructions (Addendum)
Schedule eye doctor for vision check. Start vitamin D 16109 units weekly - I've sent this in to pharmacy.  Take for 3 months. I am a bit worried about your hearing and memory.  Return in 1 month to recheck both, come with Leonette Most. Return in 6 months for recheck, a few weeks prior fasting for blood work. I've given you packet on advanced directives for you to look at and we will discuss at next appointment. Pneumonia shot today.

## 2012-01-29 NOTE — Assessment & Plan Note (Addendum)
Alert but difficulty with orientation (alert to person, somewhat to place, minimally to time). Able to repeat but not recall.  Unable to do calculations (20 subtract 3).  Did not know name of president. rtc 1 mo for geriatric assessment. Concern for dementia, likely senile.  If MMSE consistent with this, will recommend against driving. No concerns with ADLs, denies concerns with IADLs

## 2012-01-29 NOTE — Assessment & Plan Note (Signed)
Disimpaction performed, pt tolerated well. cerumen cleaned from R ear canal - able to visualize TM well - clear.   Unable to fully remove from left canal - and unable to visualize TM.  Asked her to use dilute h2o2 and will reassess next visit.

## 2012-01-29 NOTE — Assessment & Plan Note (Signed)
Recheck next visit 

## 2012-01-29 NOTE — Progress Notes (Signed)
Subjective:    Patient ID: Angela Fuentes, female    DOB: [birth year], 78 y.o.   MRN: [id number]  HPI CC: medicare wellness  No questions or concerns today.  H/o carotid stenosis, no recent US - last was 2009.  Will repeat US today.  HTN - compliant with all meds, actually was taking 5mg  and 10mg  of amlodipine together.  Advised stop 5mg  dose.  bp staying elevated but overall stable for her.  Hearing - continues to drive.  Failed hearing screen - but "I wouldn't wear hearing aides".  Discussed referral to audiologist.  Would like to think about this.   Vision - passes vision screen today.  Preventative: Unsure last mammogram.  Doesn't think would like to repeat. Colonoscopy - declines. Pneumonia shot - would like today. Due for tetanus - but wants to wait, prefers 1 shot at a time. Shingles - unsure about this S/p hysterectomy - for ovarian cancer. Advanced directives - hasn't had done. Wants HCPOA as Leonette Most then brother Brantley Stage) then 2 sisters  No falls in last year. Denies anhedonia, depression, sadness.  Medications and allergies reviewed and updated in chart.  Past histories reviewed and updated if relevant as below. Patient Active Problem List  Diagnosis  . HYPERLIPIDEMIA  . VERTIGO SECONDARY TO LABRYNTHITIS  . HYPERTENSION  . CAROTID ARTERY STENOSIS, BILATERAL  . CHOLELITHIASIS  . SYNCOPE AND COLLAPSE  . CHEST PAIN  . HYPERGLYCEMIA  . HX, PERSONAL, MALIGNANCY, OVARY  . CKD (chronic kidney disease) stage 4, GFR 15-29 ml/min  . Vitamin d deficiency   Past Medical History  Diagnosis Date  . Syncope and collapse   . Occlusion and stenosis of carotid artery without mention of cerebral infarction   . Chest pain, unspecified   . CKD (chronic kidney disease) stage 4, GFR 15-29 ml/min     s/p eval by renal 09/2010 (normal SPEP/UPEP)  . Other abnormal glucose   . Cardiomegaly   . Calculus of gallbladder without mention of cholecystitis or obstruction   . HLD  (hyperlipidemia) 11/01/1997  . HTN (hypertension) 11/01/1997  . Personal history of malignant neoplasm of ovary   . DDD (degenerative disc disease) 12/2000    Cervical series /MRI neck multi level degen changes 05/02: cervical x-ray DDD 06/2003  . Labyrinthitis, unspecified   . Cancer 1960's    Ovarian cancer//hysterectomy  . Vitamin d deficiency    Past Surgical History  Procedure Date  . Back surgery     L5/S1 ( Dr. Ocie Bob)  . Vaginal hysterectomy 1960    ovarian cancer   . Cholecystectomy 08/03/1949  . Carotid ultrasound 05/22/2008    carotid ultrasound stable 60-79 RICA  0-39LICA stable   . Adenosine myoview 12/21/2007    Normal  . US echocardiography 02/19/2005    Echo EF 65% tr AR grossly normal   History  Substance Use Topics  . Smoking status: Never Smoker   . Smokeless tobacco: Never Used  . Alcohol Use: No   Family History  Problem Relation Age of Onset  . Hypertension Mother   . Stroke Mother 26    cva (hemm)  . Cancer Father     Prostate cancer  . COPD Brother     emphysema   Allergies  Allergen Reactions  . Penicillins     REACTION: whelps   Current Outpatient Prescriptions on File Prior to Visit  Medication Sig Dispense Refill  . amLODipine (NORVASC) 10 MG tablet Take 1 tablet (10 mg total) by mouth daily.  90 tablet  3  . aspirin 81 MG tablet Take 81 mg by mouth daily.        Marland Kitchen atenolol (TENORMIN) 25 MG tablet Take 1 tablet (25 mg total) by mouth daily.  90 tablet  3  . lisinopril (PRINIVIL,ZESTRIL) 20 MG tablet Take 1 tablet (20 mg total) by mouth daily.  90 tablet  3  . rosuvastatin (CRESTOR) 20 MG tablet Take 1 tablet (20 mg total) by mouth at bedtime.  90 tablet  3  . triamterene-hydrochlorothiazide (MAXZIDE-25) 37.5-25 MG per tablet Take 1 tablet by mouth daily.          Review of Systems  Constitutional: Negative for fever, chills, activity change, appetite change, fatigue and unexpected weight change.  HENT: Negative for hearing loss  and neck pain.   Eyes: Negative for visual disturbance.  Respiratory: Negative for cough, chest tightness, shortness of breath and wheezing.   Cardiovascular: Negative for chest pain, palpitations and leg swelling.  Gastrointestinal: Negative for nausea, vomiting, abdominal pain, diarrhea, constipation, blood in stool and abdominal distention.  Genitourinary: Negative for hematuria and difficulty urinating.  Musculoskeletal: Negative for myalgias and arthralgias.  Skin: Negative for rash.  Neurological: Negative for dizziness, seizures, syncope and headaches.  Hematological: Bruises/bleeds easily.  Psychiatric/Behavioral: Negative for dysphoric mood. The patient is not nervous/anxious.        Objective:   Physical Exam  Nursing note and vitals reviewed. Constitutional: She appears well-developed and well-nourished. No distress.  HENT:  Head: Normocephalic and atraumatic.  Right Ear: Tympanic membrane and external ear normal. Decreased hearing is noted.  Left Ear: External ear normal. Decreased hearing is noted.  Nose: Nose normal. No mucosal edema or rhinorrhea.  Mouth/Throat: Uvula is midline, oropharynx is clear and moist and mucous membranes are normal. No oropharyngeal exudate, posterior oropharyngeal edema, posterior oropharyngeal erythema or tonsillar abscesses.       Cerumen impaction bilaterally, cerumen disimpaction performed with plastic curette and alligator forceps under direct visualization.  Successful on right, TM pearly gray.   Unable to fully clean on left Pt tolerated well.  Eyes: Conjunctivae and EOM are normal. Pupils are equal, round, and reactive to light. No scleral icterus.  Neck: Normal range of motion. Neck supple. Carotid bruit is present (mild R).  Cardiovascular: Normal rate, regular rhythm, normal heart sounds and intact distal pulses.   No murmur heard. Pulses:      Radial pulses are 2+ on the right side, and 2+ on the left side.  Pulmonary/Chest:  Effort normal and breath sounds normal. No respiratory distress. She has no wheezes. She has no rales.  Musculoskeletal: Normal range of motion. She exhibits edema (mild nonpitting edema).  Lymphadenopathy:    She has no cervical adenopathy.  Neurological: She is alert.       CN grossly intact, station and gait intact although slowed  Skin: Skin is warm and dry. No rash noted.  Psychiatric: She has a normal mood and affect. Her behavior is normal. Judgment and thought content normal.      Assessment & Plan:

## 2012-01-29 NOTE — Assessment & Plan Note (Signed)
Chronic. Had been taking 15mg  norvasc daily, rec back down to 10mg .   Continue to monitor. Difficult to control - currently on 5 different meds BP Readings from Last 3 Encounters:  01/29/12 147/77  10/01/11 160/90  06/12/11 136/70

## 2012-01-29 NOTE — Assessment & Plan Note (Signed)
Discussed with pt - states she would consider surgery if high grade blockage - will order carotid US.

## 2012-01-29 NOTE — Assessment & Plan Note (Signed)
Continued prediabetes range - consider A1c in future.

## 2012-02-25 ENCOUNTER — Encounter: Payer: Self-pay | Admitting: Family Medicine

## 2012-02-25 ENCOUNTER — Ambulatory Visit (INDEPENDENT_AMBULATORY_CARE_PROVIDER_SITE_OTHER): Payer: Medicare Other | Admitting: Family Medicine

## 2012-02-25 VITALS — BP 148/70 | HR 62 | Temp 97.5°F | Wt 146.0 lb

## 2012-02-25 DIAGNOSIS — E559 Vitamin D deficiency, unspecified: Secondary | ICD-10-CM

## 2012-02-25 DIAGNOSIS — E538 Deficiency of other specified B group vitamins: Secondary | ICD-10-CM | POA: Insufficient documentation

## 2012-02-25 DIAGNOSIS — I6529 Occlusion and stenosis of unspecified carotid artery: Secondary | ICD-10-CM

## 2012-02-25 DIAGNOSIS — N184 Chronic kidney disease, stage 4 (severe): Secondary | ICD-10-CM

## 2012-02-25 DIAGNOSIS — N2581 Secondary hyperparathyroidism of renal origin: Secondary | ICD-10-CM | POA: Insufficient documentation

## 2012-02-25 DIAGNOSIS — F0390 Unspecified dementia without behavioral disturbance: Secondary | ICD-10-CM

## 2012-02-25 DIAGNOSIS — Z79899 Other long term (current) drug therapy: Secondary | ICD-10-CM

## 2012-02-25 DIAGNOSIS — I10 Essential (primary) hypertension: Secondary | ICD-10-CM

## 2012-02-25 LAB — RENAL FUNCTION PANEL
CO2: 24 mEq/L (ref 19–32)
Calcium: 9.5 mg/dL (ref 8.4–10.5)
Chloride: 105 mEq/L (ref 96–112)
Creatinine, Ser: 2 mg/dL — ABNORMAL HIGH (ref 0.4–1.2)
GFR: 25.5 mL/min — ABNORMAL LOW (ref >60.00)
Potassium: 5.1 mEq/L (ref 3.5–5.1)
Sodium: 138 mEq/L (ref 135–145)

## 2012-02-25 LAB — VITAMIN B12: Vitamin B-12: 247 pg/mL (ref 211–911)

## 2012-02-25 MED ORDER — TRIAMTERENE-HCTZ 37.5-25 MG PO TABS: 1.0000 | ORAL_TABLET | Freq: Every day | ORAL | Status: DC

## 2012-02-25 MED ORDER — ATENOLOL 25 MG PO TABS: 25.0000 mg | ORAL_TABLET | Freq: Every day | ORAL | Status: DC

## 2012-02-25 MED ORDER — DONEPEZIL HCL 5 MG PO TABS: 5.0000 mg | ORAL_TABLET | Freq: Every evening | ORAL | Status: DC | PRN

## 2012-02-25 NOTE — Assessment & Plan Note (Addendum)
New dx.  MMSE 13/30. Discussed with pt and husband. rec against pt driving.  Pt and husband agree. Discussed aricept use, will do trial for 1 mo, if no noted improvement, likely stop. Check blood work including b12.  No RPR as low yield.  No CT given age and h/o PVD as already on antihypertensives, statin and aspirin.

## 2012-02-25 NOTE — Assessment & Plan Note (Signed)
Will be due for rpt 05/2012.

## 2012-02-25 NOTE — Assessment & Plan Note (Signed)
Has had renal w/u in past.  If deteriorating, consider rpt referral.

## 2012-02-25 NOTE — Progress Notes (Signed)
Subjective:    Patient ID: Angela Fuentes, female    DOB: 1921/07/10, 78 y.o.   MRN: [id number]  HPI CC: 1 mo f/u for geriatric assessment.  No other concerns today.   2 living sisters, 5 deceased siblings, denies fmhx memory problems.  Husband hasn't noticed any problems with memory.  Geriatric Assessment:  Activities of Daily Living:     Bathing- independent    Dressing-independent    Eating-independent    Toileting-independent    Transferring-independent    Continence-independent Overall Assessment:independent  Instrumental Activities of Daily Living:     Transportation- independent    Meal/Food Preparation- independent    Shopping Errands-independent    Housekeeping/Chores-independent    Money Management/Finances- dependent    Medication Management-independent    Ability to Use Telephone- independent    Laundry- independent Overall Assessment: independent  Mental Status Exam: 13/30 (value/max value)     Clock Drawing Score: unable to get all numbers correct, unable to signal specific time.   Worked until 3 years ago.  Has been driving. Medications and allergies reviewed and updated in chart.  Past histories reviewed and updated if relevant as below. Patient Active Problem List  Diagnosis  . HYPERLIPIDEMIA  . VERTIGO SECONDARY TO LABRYNTHITIS  . HYPERTENSION  . CAROTID ARTERY STENOSIS, BILATERAL  . CHOLELITHIASIS  . SYNCOPE AND COLLAPSE  . CHEST PAIN  . HYPERGLYCEMIA  . HX, PERSONAL, MALIGNANCY, OVARY  . CKD (chronic kidney disease) stage 4, GFR 15-29 ml/min  . Vitamin d deficiency  . Medicare annual wellness visit, initial  . Cerumen impaction  . Dementia  . Hearing loss   Past Medical History  Diagnosis Date  . Syncope and collapse   . Carotid stenosis, bilateral   . Chest pain, unspecified   . CKD (chronic kidney disease) stage 4, GFR 15-29 ml/min     s/p eval by renal 09/2010 (normal SPEP/UPEP)  . Hyperglycemia   . Cardiomegaly   . Calculus of  gallbladder without mention of cholecystitis or obstruction   . HLD (hyperlipidemia) 11/01/1997  . HTN (hypertension) 11/01/1997  . Personal history of malignant neoplasm of ovary 1960s    s/p hysterectomy  . DDD (degenerative disc disease) 12/2000    Cervical series /MRI neck multi level degen changes 05/02: cervical x-ray DDD 06/2003  . Labyrinthitis, unspecified   . Vitamin d deficiency    Past Surgical History  Procedure Date  . Back surgery     L5/S1 ( Dr. Ocie Bob)  . Vaginal hysterectomy 1960    ovarian cancer   . Cholecystectomy 08/03/1949  . Carotid ultrasound 05/22/2008    carotid ultrasound stable 60-79 RICA  0-39LICA stable   . Adenosine myoview 12/21/2007    Normal  . US echocardiography 02/19/2005    Echo EF 65% tr AR grossly normal   History  Substance Use Topics  . Smoking status: Never Smoker   . Smokeless tobacco: Never Used  . Alcohol Use: No   Family History  Problem Relation Age of Onset  . Hypertension Mother   . Stroke Mother 33    cva (hemm)  . Cancer Father     Prostate cancer  . COPD Brother     emphysema   Allergies  Allergen Reactions  . Penicillins     REACTION: whelps   Current Outpatient Prescriptions on File Prior to Visit  Medication Sig Dispense Refill  . amLODipine (NORVASC) 10 MG tablet Take 1 tablet (10 mg total) by mouth daily.  90 tablet  3  . aspirin 81 MG tablet Take 81 mg by mouth daily.        . ergocalciferol (VITAMIN D2) 50000 UNITS capsule Take 1 capsule (50,000 Units total) by mouth once a week.  12 capsule  1  . lisinopril (PRINIVIL,ZESTRIL) 20 MG tablet Take 1 tablet (20 mg total) by mouth daily.  90 tablet  3  . rosuvastatin (CRESTOR) 20 MG tablet Take 1 tablet (20 mg total) by mouth at bedtime.  90 tablet  3  . DISCONTD: atenolol (TENORMIN) 25 MG tablet Take 1 tablet (25 mg total) by mouth daily.  90 tablet  3  . DISCONTD: triamterene-hydrochlorothiazide (MAXZIDE-25) 37.5-25 MG per tablet Take 1 tablet by mouth  daily.       Marland Kitchen donepezil (ARICEPT) 5 MG tablet Take 1 tablet (5 mg total) by mouth at bedtime as needed.  30 tablet  3     Review of Systems Per HPI    Objective:   Physical Exam  Nursing note and vitals reviewed. Constitutional: She appears well-developed and well-nourished. No distress.  Psychiatric:       Repeated attempts with MMSE       Assessment & Plan:

## 2012-02-25 NOTE — Assessment & Plan Note (Signed)
Stable, continue meds.  Difficult to control in past BP Readings from Last 3 Encounters:  02/25/12 148/70  01/29/12 147/77  10/01/11 160/90

## 2012-02-25 NOTE — Patient Instructions (Addendum)
I am worried about dementia. Recommend against driving. Blood work today. Trial of aricept for memory - take 5mg  daily. Return in 1-2 months for follow up. Your vitamin D level was very low - ensure you're taking 50,000 units weekly (prescription that was previously sent).

## 2012-02-25 NOTE — Assessment & Plan Note (Signed)
Asked husband to verify she is taking weekly vit D.

## 2012-03-01 ENCOUNTER — Ambulatory Visit: Payer: Medicare Other | Admitting: Family Medicine

## 2012-04-28 ENCOUNTER — Telehealth: Payer: Self-pay | Admitting: Family Medicine

## 2012-04-28 ENCOUNTER — Encounter: Payer: Self-pay | Admitting: Family Medicine

## 2012-04-28 ENCOUNTER — Ambulatory Visit (INDEPENDENT_AMBULATORY_CARE_PROVIDER_SITE_OTHER): Payer: Medicare Other | Admitting: Family Medicine

## 2012-04-28 VITALS — BP 138/72 | HR 64 | Temp 97.6°F | Wt 144.2 lb

## 2012-04-28 DIAGNOSIS — Z23 Encounter for immunization: Secondary | ICD-10-CM

## 2012-04-28 DIAGNOSIS — F0390 Unspecified dementia without behavioral disturbance: Secondary | ICD-10-CM

## 2012-04-28 DIAGNOSIS — E559 Vitamin D deficiency, unspecified: Secondary | ICD-10-CM

## 2012-04-28 DIAGNOSIS — I6529 Occlusion and stenosis of unspecified carotid artery: Secondary | ICD-10-CM

## 2012-04-28 DIAGNOSIS — E538 Deficiency of other specified B group vitamins: Secondary | ICD-10-CM

## 2012-04-28 DIAGNOSIS — I10 Essential (primary) hypertension: Secondary | ICD-10-CM

## 2012-04-28 MED ORDER — LISINOPRIL 20 MG PO TABS: 20.0000 mg | ORAL_TABLET | Freq: Every day | ORAL | Status: DC

## 2012-04-28 MED ORDER — DONEPEZIL HCL 5 MG PO TABS: 5.0000 mg | ORAL_TABLET | Freq: Every evening | ORAL | Status: DC | PRN

## 2012-04-28 MED ORDER — ROSUVASTATIN CALCIUM 20 MG PO TABS: 20.0000 mg | ORAL_TABLET | Freq: Every day | ORAL | Status: DC

## 2012-04-28 MED ORDER — ATENOLOL 25 MG PO TABS: 25.0000 mg | ORAL_TABLET | Freq: Every day | ORAL | Status: DC

## 2012-04-28 MED ORDER — AMLODIPINE BESYLATE 10 MG PO TABS: 10.0000 mg | ORAL_TABLET | Freq: Every day | ORAL | Status: DC

## 2012-04-28 MED ORDER — TRIAMTERENE-HCTZ 37.5-25 MG PO TABS: 1.0000 | ORAL_TABLET | Freq: Every day | ORAL | Status: DC

## 2012-04-28 NOTE — Assessment & Plan Note (Signed)
Discussed completion of weekly vit D, then recommended start vit D 2000 IU daily.

## 2012-04-28 NOTE — Telephone Encounter (Addendum)
I forgot to discuss this at OV - pt was found to have borderline low B12 shots - I recommend she come in for monthly injections for 8 mo Also pt due for carotid US next month - to let us know if this is not done

## 2012-04-28 NOTE — Assessment & Plan Note (Signed)
Due for repeat next month.  order already in chart.

## 2012-04-28 NOTE — Assessment & Plan Note (Signed)
Best control to date. continue regimen. May need to change HCTZ in maxzide given CRI (likely not as effective).

## 2012-04-28 NOTE — Patient Instructions (Signed)
Vitamin D - your level was low.  Finish weekly course, then start taking 2000 IU of vit D over the counter daily. Continue meds as up to now. Congratulations on best blood pressure to date! Continue aricept (donepezil) one pill daily. Flu shot today.

## 2012-04-28 NOTE — Progress Notes (Signed)
  Subjective:    Patient ID: Angela Fuentes, female    DOB: 1921-05-01, 78 y.o.   MRN: [id number]  HPI CC: 78mo f/u  I asked Angela Fuentes and her husband to come in today as f/u for starting aricept.  Pt states tolerating well, husband states doing well as well.  No significant change noted.  Last visit recommended she stop driving.  Denies nausea/vomiting.  HTN - best control to date.  Compliant with meds.  No HA, vision changes, CP/tightness, SOB, leg swelling.  Asks i refill several of her meds.  Pt, husband endorse she is taking vit D weekly, states taking on Saturdays.  Past Medical History  Diagnosis Date  . Syncope and collapse   . Carotid stenosis, bilateral   . Chest pain, unspecified   . CKD (chronic kidney disease) stage 4, GFR 15-29 ml/min     s/p eval by renal 09/2010 (normal SPEP/UPEP)  . Hyperglycemia   . Cardiomegaly   . Calculus of gallbladder without mention of cholecystitis or obstruction   . HLD (hyperlipidemia) 11/01/1997  . HTN (hypertension) 11/01/1997  . Personal history of malignant neoplasm of ovary 1960s    s/p hysterectomy  . DDD (degenerative disc disease) 12/2000    Cervical series /MRI neck multi level degen changes 05/02: cervical x-ray DDD 06/2003  . Labyrinthitis, unspecified   . Vitamin d deficiency   . B12 deficiency   . Dementia      Review of Systems Per HPI    Objective:   Physical Exam  Nursing note and vitals reviewed. Constitutional: She appears well-developed and well-nourished. No distress.  HENT:  Head: Normocephalic and atraumatic.  Mouth/Throat: Oropharynx is clear and moist. No oropharyngeal exudate.  Eyes: Conjunctivae normal and EOM are normal. Pupils are equal, round, and reactive to light. No scleral icterus.  Neck: Normal range of motion. Neck supple. Carotid bruit is not present.  Cardiovascular: Normal rate, regular rhythm, normal heart sounds and intact distal pulses.   No murmur heard. Pulmonary/Chest: Effort normal  and breath sounds normal. No respiratory distress. She has no wheezes. She has no rales.  Musculoskeletal: She exhibits no edema.  Lymphadenopathy:    She has no cervical adenopathy.  Skin: Skin is warm and dry. No rash noted.       Assessment & Plan:

## 2012-04-28 NOTE — Assessment & Plan Note (Signed)
Found to be borderline low vit B12 - recommended monthly shots.  Has not started. Will need to set up.  I will ask Selena Batten to call to set up monthly injections here.

## 2012-04-28 NOTE — Assessment & Plan Note (Signed)
Stable on low dose aricept, continue for now. Independent in ADLs and IADLs. Also found to have borderline low B12.  Recommended B12 supplementation and consider checking MMA at next OV.

## 2012-04-29 NOTE — Telephone Encounter (Signed)
Patient notified and appt scheduled.

## 2012-05-03 ENCOUNTER — Ambulatory Visit: Payer: Medicare Other

## 2012-05-04 ENCOUNTER — Ambulatory Visit (INDEPENDENT_AMBULATORY_CARE_PROVIDER_SITE_OTHER): Payer: Medicare Other | Admitting: *Deleted

## 2012-05-04 DIAGNOSIS — E538 Deficiency of other specified B group vitamins: Secondary | ICD-10-CM

## 2012-05-04 MED ORDER — CYANOCOBALAMIN 1000 MCG/ML IJ SOLN
1000.0000 ug | Freq: Once | INTRAMUSCULAR | Status: AC
  Administered 2012-05-04: 1000 ug via INTRAMUSCULAR

## 2012-06-07 ENCOUNTER — Ambulatory Visit: Payer: Medicare Other

## 2012-06-08 ENCOUNTER — Ambulatory Visit (INDEPENDENT_AMBULATORY_CARE_PROVIDER_SITE_OTHER): Payer: Medicare Other | Admitting: *Deleted

## 2012-06-08 DIAGNOSIS — E538 Deficiency of other specified B group vitamins: Secondary | ICD-10-CM

## 2012-06-08 MED ORDER — CYANOCOBALAMIN 1000 MCG/ML IJ SOLN
1000.0000 ug | Freq: Once | INTRAMUSCULAR | Status: AC
  Administered 2012-06-08: 1000 ug via INTRAMUSCULAR

## 2012-07-12 ENCOUNTER — Ambulatory Visit (INDEPENDENT_AMBULATORY_CARE_PROVIDER_SITE_OTHER): Payer: Medicare Other | Admitting: *Deleted

## 2012-07-12 DIAGNOSIS — E538 Deficiency of other specified B group vitamins: Secondary | ICD-10-CM

## 2012-07-12 MED ORDER — CYANOCOBALAMIN 1000 MCG/ML IJ SOLN
1000.0000 ug | Freq: Once | INTRAMUSCULAR | Status: AC
  Administered 2012-07-12: 1000 ug via INTRAMUSCULAR

## 2012-08-03 HISTORY — PX: OTHER SURGICAL HISTORY: SHX169

## 2012-08-04 ENCOUNTER — Emergency Department: Payer: Self-pay | Admitting: Emergency Medicine

## 2012-08-04 LAB — URINALYSIS, COMPLETE
Bacteria: NONE SEEN
Bilirubin,UR: NEGATIVE
Blood: NEGATIVE
Glucose,UR: NEGATIVE mg/dL (ref 0–75)
Hyaline Cast: 1
Nitrite: NEGATIVE
Ph: 8 (ref 4.5–8.0)
Protein: NEGATIVE
RBC,UR: <1 /HPF (ref 0–5)
Specific Gravity: 1.009 (ref 1.003–1.030)

## 2012-08-04 LAB — CBC WITH DIFFERENTIAL/PLATELET
Basophil %: 1 %
Eosinophil %: 1.9 %
HCT: 36.4 % (ref 35.0–47.0)
HGB: 12.1 g/dL (ref 12.0–16.0)
MCHC: 33.2 g/dL (ref 32.0–36.0)
MCV: 88 fL (ref 80–100)
Monocyte %: 8.4 %
Neutrophil #: 3.6 10*3/uL (ref 1.4–6.5)
Neutrophil %: 62.6 %
Platelet: 156 10*3/uL (ref 150–440)
RDW: 13.9 % (ref 11.5–14.5)
WBC: 5.8 10*3/uL (ref 3.6–11.0)

## 2012-08-04 LAB — COMPREHENSIVE METABOLIC PANEL
Alkaline Phosphatase: 82 U/L (ref 50–136)
Bilirubin,Total: 0.6 mg/dL (ref 0.2–1.0)
Calcium, Total: 8.2 mg/dL — ABNORMAL LOW (ref 8.5–10.1)
Chloride: 108 mmol/L — ABNORMAL HIGH (ref 98–107)
EGFR (African American): 33 — ABNORMAL LOW
EGFR (Non-African Amer.): 28 — ABNORMAL LOW
Osmolality: 291 (ref 275–301)
Potassium: 4.6 mmol/L (ref 3.5–5.1)
Total Protein: 6.8 g/dL (ref 6.4–8.2)

## 2012-08-12 ENCOUNTER — Encounter: Payer: Self-pay | Admitting: Family Medicine

## 2012-08-12 ENCOUNTER — Ambulatory Visit: Payer: Medicare Other

## 2012-08-12 ENCOUNTER — Ambulatory Visit (INDEPENDENT_AMBULATORY_CARE_PROVIDER_SITE_OTHER): Payer: Medicare Other | Admitting: Family Medicine

## 2012-08-12 VITALS — BP 156/78 | HR 64 | Temp 97.9°F | Wt 143.8 lb

## 2012-08-12 DIAGNOSIS — W19XXXA Unspecified fall, initial encounter: Secondary | ICD-10-CM

## 2012-08-12 DIAGNOSIS — F0390 Unspecified dementia without behavioral disturbance: Secondary | ICD-10-CM

## 2012-08-12 DIAGNOSIS — N184 Chronic kidney disease, stage 4 (severe): Secondary | ICD-10-CM

## 2012-08-12 DIAGNOSIS — T1490XA Injury, unspecified, initial encounter: Secondary | ICD-10-CM

## 2012-08-12 DIAGNOSIS — R55 Syncope and collapse: Secondary | ICD-10-CM

## 2012-08-12 DIAGNOSIS — E538 Deficiency of other specified B group vitamins: Secondary | ICD-10-CM

## 2012-08-12 DIAGNOSIS — I6529 Occlusion and stenosis of unspecified carotid artery: Secondary | ICD-10-CM

## 2012-08-12 MED ORDER — CYANOCOBALAMIN 1000 MCG/ML IJ SOLN
1000.0000 ug | Freq: Once | INTRAMUSCULAR | Status: AC
  Administered 2012-08-12: 1000 ug via INTRAMUSCULAR

## 2012-08-12 NOTE — Patient Instructions (Signed)
I am worried about this passing out episode - recommend further investigation to find cause of these episodes. Pass by Marion's office to schedule ultrasound of neck arteries. Pass by Marion's office to schedule CT scan of head. We will call you with results of both of these studies and may order ultrasound of heart. Good to see you, call us with questions.

## 2012-08-12 NOTE — Assessment & Plan Note (Signed)
Passing out episode s/p eval at ER, could be orthostatic, however today hypertensive. Known baseline CRI 1.3-1.8 points against ER dx of dehydration given her Cr was 1.59 in hospital. I did recommend further evaluation with carotid US (known stenosis, due for rpt [place]), and head CT (esp given recent fall).  If stable, consider rpt echo (latest was 2006 and overall stable). Reviewed EKG from ER, asked to scan - sinus bradycardia.

## 2012-08-12 NOTE — Progress Notes (Signed)
Subjective:    Patient ID: Angela Fuentes, female    DOB: March 10, 1921, 79 y.o.   MRN: [id number]  HPI CC: ER f/u  [name] presents with her husband today as ER follow up for [facility] ER visit on 08/04/2012 after syncope at home while standing over sink - husband witnessed, states pt's eyes rolled to back of head and there was LOC.  EMS called and took pt to hospital.  Some confusion as well.  Dx with dehydration, Cr of 1.59.  Records reviewed.  CBC WNL.  Mild hypocalcemia to 8.2, with alb 3.8.  Known vit D def.  UA WNL, CXR no acute process, mild CM.  No witnessed seizure activity.  No incontinence or tongue biting. Lab Results  Component Value Date   CREATININE 2.0* 02/25/2012    After discharge from ER, next day suffered a fall where she hit head with some bruising.  Denies HA.  Denies presyncope at this episode, denies LOC.  Large residual goose egg.  Denies dizziness, chest pain/tightness, SOB, leg swelling.  Some difficulty with story 2/2 dementia - husband helps give story.  Past Medical History  Diagnosis Date  . Syncope and collapse   . Carotid stenosis, bilateral   . Chest pain, unspecified   . CKD (chronic kidney disease) stage 4, GFR 15-29 ml/min     s/p eval by renal 09/2010 (normal SPEP/UPEP)  . Hyperglycemia   . Cardiomegaly   . Calculus of gallbladder without mention of cholecystitis or obstruction   . HLD (hyperlipidemia) 11/01/1997  . HTN (hypertension) 11/01/1997  . Personal history of malignant neoplasm of ovary 1960s    s/p hysterectomy  . DDD (degenerative disc disease) 12/2000    Cervical series /MRI neck multi level degen changes 05/02: cervical x-ray DDD 06/2003  . Labyrinthitis, unspecified   . Vitamin D deficiency   . B12 deficiency   . Dementia    Past Surgical History  Procedure Date  . Back surgery     L5/S1 ( Dr. Ocie Bob)  . Vaginal hysterectomy 1960    ovarian cancer   . Cholecystectomy 08/03/1949  . Carotid ultrasound 05/23/2011   carotid ultrasound 60-79 RICA  0-39LICA stable , rec rpt 1 yr  . Adenosine myoview 12/21/2007    Normal  . US echocardiography 02/19/2005    Echo EF 65% tr AR grossly normal   Review of Systems Per HPI    Objective:   Physical Exam  Nursing note and vitals reviewed. Constitutional: She appears well-developed and well-nourished. No distress.  HENT:  Head: Normocephalic. Head is with contusion.    Mouth/Throat: Oropharynx is clear and moist. No oropharyngeal exudate.       Large goose egg R occipital region along with ecchymoses inferior to goose egg where she hit head.  Eyes: Conjunctivae normal and EOM are normal. Pupils are equal, round, and reactive to light. No scleral icterus.       No nystagmus  Neck: Normal range of motion. Neck supple. Carotid bruit is not present (none appreciated).  Cardiovascular: Normal rate, regular rhythm, normal heart sounds and intact distal pulses.   No murmur heard. Pulmonary/Chest: Effort normal and breath sounds normal. No respiratory distress. She has no wheezes. She has no rales.  Musculoskeletal: She exhibits no edema.  Lymphadenopathy:    She has no cervical adenopathy.  Neurological: She has normal strength. No cranial nerve deficit or sensory deficit. She displays a negative Romberg sign.  Reflex Scores:      Bicep  reflexes are 2+ on the right side and 2+ on the left side.      Patellar reflexes are 2+ on the right side and 2+ on the left side.      CN 2-12 intact Diminished speed with FTN testing on left Neg pronator drift 5/5 strength BUE       Assessment & Plan:

## 2012-08-12 NOTE — Assessment & Plan Note (Addendum)
First fall attributed to syncope. 2nd fall unclear etiology, pt denies LOC or presyncope. Did suffer head injury with 2nd fall, but denies residual HA.   Only on aspirin 81mg  daily. Given fall and to further evaluate syncope, check CT head today (r/o subdural).

## 2012-08-18 ENCOUNTER — Ambulatory Visit: Payer: Self-pay | Admitting: Family Medicine

## 2012-08-18 ENCOUNTER — Encounter: Payer: Self-pay | Admitting: Family Medicine

## 2012-08-19 ENCOUNTER — Encounter: Payer: Self-pay | Admitting: *Deleted

## 2012-08-25 ENCOUNTER — Encounter (INDEPENDENT_AMBULATORY_CARE_PROVIDER_SITE_OTHER): Payer: Medicare Other

## 2012-08-25 DIAGNOSIS — I6529 Occlusion and stenosis of unspecified carotid artery: Secondary | ICD-10-CM

## 2012-08-25 DIAGNOSIS — R55 Syncope and collapse: Secondary | ICD-10-CM

## 2012-08-30 ENCOUNTER — Other Ambulatory Visit: Payer: Self-pay | Admitting: Family Medicine

## 2012-08-30 DIAGNOSIS — I6529 Occlusion and stenosis of unspecified carotid artery: Secondary | ICD-10-CM

## 2012-08-31 ENCOUNTER — Encounter: Payer: Self-pay | Admitting: *Deleted

## 2012-09-13 ENCOUNTER — Ambulatory Visit (INDEPENDENT_AMBULATORY_CARE_PROVIDER_SITE_OTHER): Payer: Medicare Other | Admitting: *Deleted

## 2012-09-13 DIAGNOSIS — E538 Deficiency of other specified B group vitamins: Secondary | ICD-10-CM

## 2012-09-13 MED ORDER — CYANOCOBALAMIN 1000 MCG/ML IJ SOLN
1000.0000 ug | Freq: Once | INTRAMUSCULAR | Status: AC
  Administered 2012-09-13: 1000 ug via INTRAMUSCULAR

## 2012-10-12 ENCOUNTER — Ambulatory Visit (INDEPENDENT_AMBULATORY_CARE_PROVIDER_SITE_OTHER): Payer: Medicare Other | Admitting: *Deleted

## 2012-10-12 DIAGNOSIS — E538 Deficiency of other specified B group vitamins: Secondary | ICD-10-CM

## 2012-10-12 MED ORDER — CYANOCOBALAMIN 1000 MCG/ML IJ SOLN
1000.0000 ug | Freq: Once | INTRAMUSCULAR | Status: AC
  Administered 2012-10-12: 1000 ug via INTRAMUSCULAR

## 2012-11-22 ENCOUNTER — Encounter: Payer: Self-pay | Admitting: Family Medicine

## 2012-11-22 ENCOUNTER — Ambulatory Visit (INDEPENDENT_AMBULATORY_CARE_PROVIDER_SITE_OTHER): Payer: Medicare Other | Admitting: Family Medicine

## 2012-11-22 VITALS — BP 138/68 | HR 74 | Temp 98.1°F | Ht 65.0 in | Wt 142.5 lb

## 2012-11-22 DIAGNOSIS — I6529 Occlusion and stenosis of unspecified carotid artery: Secondary | ICD-10-CM

## 2012-11-22 DIAGNOSIS — F0390 Unspecified dementia without behavioral disturbance: Secondary | ICD-10-CM

## 2012-11-22 DIAGNOSIS — N184 Chronic kidney disease, stage 4 (severe): Secondary | ICD-10-CM

## 2012-11-22 DIAGNOSIS — R079 Chest pain, unspecified: Secondary | ICD-10-CM

## 2012-11-22 DIAGNOSIS — I10 Essential (primary) hypertension: Secondary | ICD-10-CM

## 2012-11-22 DIAGNOSIS — E785 Hyperlipidemia, unspecified: Secondary | ICD-10-CM

## 2012-11-22 DIAGNOSIS — E559 Vitamin D deficiency, unspecified: Secondary | ICD-10-CM

## 2012-11-22 MED ORDER — MEMANTINE HCL ER 7 MG PO CP24: 1.0000 | ORAL_CAPSULE | Freq: Every day | ORAL | Status: DC

## 2012-11-22 NOTE — Progress Notes (Signed)
  Subjective:    Patient ID: Angela Fuentes, female    DOB: 17-Mar-1921, 79 y.o.   MRN: [id number]  HPI CC: HTN   Angela Fuentes presents with her husband today as 3 month follow up. Husband sees Dr. Robby Sermon as PCP at [facility].  HTN - No HA, vision changes, SOB.  Compliant with meds. Endorses chest discomfort described as soreness for last several months.  Started after fall sustained 08/2012.  Reproducible.  No dyspnea or nausea with this. Occasional ankle swelling.  HLD - complaint with crestor, denies myalgias  Memory - no improvement with aricept.  More trouble at home.  Wife makes meals and washes dishes.  Husband pays bills.  Husband drives.  Husband buys groceries.    H/o carotid stenosis - monitoring.  Past Medical History  Diagnosis Date  . Syncope and collapse   . Carotid stenosis, bilateral   . Chest pain, unspecified   . CKD (chronic kidney disease) stage 4, GFR 15-29 ml/min     s/p eval by renal 09/2010 (normal SPEP/UPEP)  . Hyperglycemia   . Cardiomegaly   . Calculus of gallbladder without mention of cholecystitis or obstruction   . HLD (hyperlipidemia) 11/01/1997  . HTN (hypertension) 11/01/1997  . Personal history of malignant neoplasm of ovary 1960s    s/p hysterectomy  . DDD (degenerative disc disease) 12/2000    Cervical series /MRI neck multi level degen changes 05/02: cervical x-ray DDD 06/2003  . Labyrinthitis, unspecified   . Vitamin D deficiency   . B12 deficiency   . Dementia   . History of CVA (cerebrovascular accident)     old R basal ganglia lacunar infarct by CT    Past Surgical History  Procedure Laterality Date  . Back surgery      L5/S1 ( Dr. Ocie Bob)  . Vaginal hysterectomy  1960    ovarian cancer   . Cholecystectomy  08/03/1949  . Carotid ultrasound  05/23/2011    carotid ultrasound 60-79 RICA  0-39LICA stable , rec rpt 1 yr  . Adenosine myoview  12/21/2007    Normal  . US echocardiography  02/19/2005    Echo EF 65%  tr AR grossly normal   Review of Systems Per HPI    Objective:   Physical Exam  Nursing note and vitals reviewed. Constitutional: She appears well-developed and well-nourished. No distress.  Some confusion with questions  HENT:  Head: Normocephalic and atraumatic.  Mouth/Throat: Oropharynx is clear and moist. No oropharyngeal exudate.  Eyes: Conjunctivae and EOM are normal. Pupils are equal, round, and reactive to light. No scleral icterus.  Neck: Normal range of motion. Neck supple. Carotid bruit is not present (not appreciated today).  Cardiovascular: Normal rate, regular rhythm, normal heart sounds and intact distal pulses.   No murmur heard. Pulmonary/Chest: Effort normal and breath sounds normal. No respiratory distress. She has no wheezes. She has no rales.  Musculoskeletal: She exhibits no edema.  Lymphadenopathy:    She has no cervical adenopathy.  Skin: Skin is warm and dry. No rash noted.       Assessment & Plan:

## 2012-11-22 NOTE — Assessment & Plan Note (Signed)
No noted improvement on aricept.  Will stop, do trial of namenda - sent into pharmacy.  Husband/pt agree. Low normal B12 - consider checking MMA.  Not currently taking oral B12  Pt no longer drives.  States independence in ADLs/IADLs

## 2012-11-22 NOTE — Assessment & Plan Note (Signed)
Chronic, stable on current regimen of 5 antihypertensives. Continue meds.

## 2012-11-22 NOTE — Assessment & Plan Note (Signed)
Chronic, stable continue lipitor. 

## 2012-11-22 NOTE — Patient Instructions (Addendum)
Let's check Vit D level today - we will call you with results and plan  Blood work today. Stop ARICEPT  Trial of namenda for memory - sent to pharmacy.  price this out Pass by Marion's office to schedule appointment for neck ultrasound. Return to see me in 3 months for follow up.

## 2012-11-22 NOTE — Assessment & Plan Note (Signed)
Anticipate MSK in origin as started after fall and reproducible. If persistent, consider further eval.

## 2012-11-22 NOTE — Assessment & Plan Note (Signed)
High grade stenosis R>L ICA.  Rpt due 01/2013.

## 2012-11-23 ENCOUNTER — Other Ambulatory Visit: Payer: Self-pay | Admitting: Family Medicine

## 2012-11-23 LAB — RENAL FUNCTION PANEL
BUN: 38 mg/dL — ABNORMAL HIGH (ref 6–23)
Chloride: 108 mEq/L (ref 96–112)
Creatinine, Ser: 1.7 mg/dL — ABNORMAL HIGH (ref 0.4–1.2)
GFR: 30.2 mL/min — ABNORMAL LOW (ref >60.00)
Glucose, Bld: 92 mg/dL (ref 70–99)
Phosphorus: 4 mg/dL (ref 2.3–4.6)

## 2012-11-23 LAB — VITAMIN D 25 HYDROXY (VIT D DEFICIENCY, FRACTURES): Vit D, 25-Hydroxy: 22 ng/mL — ABNORMAL LOW (ref 30–89)

## 2012-11-23 MED ORDER — ERGOCALCIFEROL 1.25 MG (50000 UT) PO CAPS: 50000.0000 [IU] | ORAL_CAPSULE | ORAL | Status: DC

## 2012-11-25 ENCOUNTER — Telehealth: Payer: Self-pay | Admitting: *Deleted

## 2012-11-25 NOTE — Telephone Encounter (Signed)
PA form for Namenda in your IN box

## 2012-11-30 NOTE — Telephone Encounter (Signed)
[employer] faxed 2nd request for Namenda prior auth; Dr Sharen Hones pt. Would you consider completing PA form; form is in your in box for your review.

## 2012-11-30 NOTE — Telephone Encounter (Signed)
I'll work on the hard copy.  

## 2012-12-02 NOTE — Telephone Encounter (Signed)
[employer] called and advised Namenda was approved. [employer] notified.

## 2013-01-31 DIAGNOSIS — E042 Nontoxic multinodular goiter: Secondary | ICD-10-CM

## 2013-01-31 HISTORY — DX: Nontoxic multinodular goiter: E04.2

## 2013-02-15 ENCOUNTER — Encounter (INDEPENDENT_AMBULATORY_CARE_PROVIDER_SITE_OTHER): Payer: Medicare Other

## 2013-02-15 DIAGNOSIS — I6529 Occlusion and stenosis of unspecified carotid artery: Secondary | ICD-10-CM

## 2013-02-20 ENCOUNTER — Encounter: Payer: Self-pay | Admitting: Family Medicine

## 2013-02-21 ENCOUNTER — Ambulatory Visit (INDEPENDENT_AMBULATORY_CARE_PROVIDER_SITE_OTHER): Payer: Medicare Other | Admitting: Family Medicine

## 2013-02-21 ENCOUNTER — Encounter: Payer: Self-pay | Admitting: Family Medicine

## 2013-02-21 VITALS — BP 128/84 | HR 68 | Temp 98.1°F | Wt 144.8 lb

## 2013-02-21 DIAGNOSIS — E538 Deficiency of other specified B group vitamins: Secondary | ICD-10-CM

## 2013-02-21 DIAGNOSIS — F0390 Unspecified dementia without behavioral disturbance: Secondary | ICD-10-CM

## 2013-02-21 DIAGNOSIS — E042 Nontoxic multinodular goiter: Secondary | ICD-10-CM

## 2013-02-21 DIAGNOSIS — I10 Essential (primary) hypertension: Secondary | ICD-10-CM

## 2013-02-21 DIAGNOSIS — I6529 Occlusion and stenosis of unspecified carotid artery: Secondary | ICD-10-CM

## 2013-02-21 MED ORDER — AMLODIPINE BESYLATE 10 MG PO TABS: 10.0000 mg | ORAL_TABLET | Freq: Every day | ORAL | Status: DC

## 2013-02-21 MED ORDER — ERGOCALCIFEROL 1.25 MG (50000 UT) PO CAPS: 50000.0000 [IU] | ORAL_CAPSULE | ORAL | Status: AC

## 2013-02-21 NOTE — Assessment & Plan Note (Signed)
Next [place] due 08/2012.

## 2013-02-21 NOTE — Assessment & Plan Note (Signed)
Incidental finding on carotid US. Discussed options with husband and wife - they have decided to continue to monitor during carotid ultrasound exam.  They are unsure if they would desire thyroid biopsy if abnormal [place]. Lab Results  Component Value Date   TSH 0.94 01/22/2012

## 2013-02-21 NOTE — Assessment & Plan Note (Signed)
Chronic, stable. Good control on current regimen. Refilled amlodipine today.

## 2013-02-21 NOTE — Assessment & Plan Note (Signed)
Check MMA next blood work. Has not recently received B12 shots.

## 2013-02-21 NOTE — Progress Notes (Signed)
  Subjective:    Patient ID: Angela Fuentes, female    DOB: 12/15/1920, 79 y.o.   MRN: [id number]  HPI CC: 44mo f/u  Angela Fuentes presents with her husband today as 3 month follow up.  Husband sees Dr. Robby Sermon as PCP at [facility].   HTN - No vision changes, SOB. Compliant with meds. Legs do swell occasionally - ankles.  seldom headaches.  Some dizziness, worse with rapid turns.  HLD - complaint with crestor, denies myalgias.  Memory - no improvement with aricept. More trouble at home. Wife makes meals and washes dishes. Husband pays bills. Husband drives. Husband buys groceries.   H/o carotid stenosis - monitoring.  Latest [place] 01/2013 - some thyroid nodules noted incidentally Lab Results  Component Value Date   TSH 0.94 01/22/2012   Past Medical History  Diagnosis Date  . Syncope and collapse   . Carotid stenosis, bilateral     [place] 01/2013, rpt 6 mo  . Chest pain, unspecified   . CKD (chronic kidney disease) stage 4, GFR 15-29 ml/min     s/p eval by renal 09/2010 (normal SPEP/UPEP)  . Hyperglycemia   . Cardiomegaly   . Calculus of gallbladder without mention of cholecystitis or obstruction   . HLD (hyperlipidemia) 11/01/1997  . HTN (hypertension) 11/01/1997  . Personal history of malignant neoplasm of ovary 1960s    s/p hysterectomy  . DDD (degenerative disc disease) 12/2000    Cervical series /MRI neck multi level degen changes 05/02: cervical x-ray DDD 06/2003  . Labyrinthitis, unspecified   . Vitamin D deficiency   . B12 deficiency   . Dementia   . History of CVA (cerebrovascular accident)     old R basal ganglia lacunar infarct by CT  . Multiple thyroid nodules 01/2013    bilateral on carotid US     Review of Systems Per HPI    Objective:   Physical Exam  Nursing note and vitals reviewed. Constitutional: She appears well-developed and well-nourished. No distress.  HENT:  Mouth/Throat: Oropharynx is clear and moist. No oropharyngeal exudate.   Cardiovascular: Normal rate, regular rhythm, normal heart sounds and intact distal pulses.   No murmur heard. Pulmonary/Chest: Effort normal and breath sounds normal. No respiratory distress. She has no wheezes. She has no rales.  Musculoskeletal: She exhibits no edema.  Skin: Skin is warm and dry. No rash noted.       Assessment & Plan:

## 2013-02-21 NOTE — Patient Instructions (Signed)
Let's continue namenda for now. Continue all blood pressure medicines We will watch thyroid nodules for now, recheck in 6 months at carotid ultrasound. Good to see you, call us with questions. Return in 4-6 months for follow up.

## 2013-02-21 NOTE — Assessment & Plan Note (Signed)
No change with aricept trial.  Will continue namenda for now. Lab Results  Component Value Date   VITAMINB12 247 02/25/2012  check MMA/HC next blood work.

## 2013-07-07 ENCOUNTER — Other Ambulatory Visit: Payer: Self-pay | Admitting: *Deleted

## 2013-07-07 MED ORDER — ROSUVASTATIN CALCIUM 20 MG PO TABS: 20.0000 mg | ORAL_TABLET | Freq: Every day | ORAL | Status: DC

## 2013-07-07 MED ORDER — ATENOLOL 25 MG PO TABS: 25.0000 mg | ORAL_TABLET | Freq: Every day | ORAL | Status: DC

## 2013-07-07 MED ORDER — TRIAMTERENE-HCTZ 37.5-25 MG PO TABS: 1.0000 | ORAL_TABLET | Freq: Every day | ORAL | Status: DC

## 2013-07-24 ENCOUNTER — Encounter: Payer: Self-pay | Admitting: Family Medicine

## 2013-07-24 ENCOUNTER — Ambulatory Visit (INDEPENDENT_AMBULATORY_CARE_PROVIDER_SITE_OTHER): Payer: Medicare Other | Admitting: Family Medicine

## 2013-07-24 VITALS — BP 132/78 | HR 61 | Temp 97.5°F | Ht 64.5 in | Wt 152.0 lb

## 2013-07-24 DIAGNOSIS — E559 Vitamin D deficiency, unspecified: Secondary | ICD-10-CM

## 2013-07-24 DIAGNOSIS — N184 Chronic kidney disease, stage 4 (severe): Secondary | ICD-10-CM

## 2013-07-24 DIAGNOSIS — E785 Hyperlipidemia, unspecified: Secondary | ICD-10-CM

## 2013-07-24 DIAGNOSIS — I10 Essential (primary) hypertension: Secondary | ICD-10-CM

## 2013-07-24 DIAGNOSIS — I6529 Occlusion and stenosis of unspecified carotid artery: Secondary | ICD-10-CM

## 2013-07-24 DIAGNOSIS — F0390 Unspecified dementia without behavioral disturbance: Secondary | ICD-10-CM

## 2013-07-24 MED ORDER — MEMANTINE HCL ER 14 MG PO CP24: 14.0000 mg | ORAL_CAPSULE | Freq: Every day | ORAL | Status: DC

## 2013-07-24 NOTE — Progress Notes (Signed)
   Subjective:    Patient ID: Angela Fuentes, female    DOB: [birth year], 79 y.o.   MRN: [id number]  HPI CC: 5 mo f/u  [name] presents with her husband today as 3 month follow up.  Husband sees Dr. Robby Sermon as PCP at [facility].   HTN - No vision changes, SOB. Some chest discomfort actually described as intermittent difficulty catching breath. Legs do swell occasionally - ankles. seldom headaches. Some dizziness, worse with rapid turns. Compliant with meds.   HLD - complaint with crestor, endorses some myalgias.  Memory - no improvement with aricept so this was stopped. More trouble at home. Wife makes meals and washes dishes. Husband pays bills.  Husband drives. Husband buys groceries.  On namenda 7mg  2 tablets daily but has only been taking one tablet daily.  Vit D def - never completed weekly 50,000 unit course.  H/o carotid stenosis - monitoring.  Latest [place] 01/2013 - some thyroid nodules noted incidentally.  rec [place] every 6 months.  Past Medical History  Diagnosis Date  . Syncope and collapse   . Carotid stenosis, bilateral     [place] 01/2013, rpt 6 mo  . Chest pain, unspecified   . CKD (chronic kidney disease) stage 4, GFR 15-29 ml/min     s/p eval by renal 09/2010 (normal SPEP/UPEP)  . Hyperglycemia   . Cardiomegaly   . Calculus of gallbladder without mention of cholecystitis or obstruction   . HLD (hyperlipidemia) 11/01/1997  . HTN (hypertension) 11/01/1997  . Personal history of malignant neoplasm of ovary 1960s    s/p hysterectomy  . DDD (degenerative disc disease) 12/2000    Cervical series /MRI neck multi level degen changes 05/02: cervical x-ray DDD 06/2003  . Labyrinthitis, unspecified   . Vitamin D deficiency   . B12 deficiency   . Dementia   . History of CVA (cerebrovascular accident)     old R basal ganglia lacunar infarct by CT  . Multiple thyroid nodules 01/2013    bilateral on carotid US     Review of Systems Per HPI    Objective:   Physical  Exam  Nursing note and vitals reviewed. Constitutional: She appears well-developed and well-nourished. No distress.  HENT:  Mouth/Throat: Oropharynx is clear and moist. No oropharyngeal exudate.  Cardiovascular: Normal rate, regular rhythm, normal heart sounds and intact distal pulses.   No murmur heard. Pulmonary/Chest: Effort normal and breath sounds normal. No respiratory distress. She has no wheezes. She has no rales.  Musculoskeletal: She exhibits no edema.       Assessment & Plan:

## 2013-07-24 NOTE — Assessment & Plan Note (Signed)
Will order rpt for 09/2013.

## 2013-07-24 NOTE — Patient Instructions (Signed)
Start green gel capsule of vitamin D once a week until you run out, then start vitamin D 2000 units daily over the counter. Blood pressure is doing well - no changes today. Blood work today. Pass by Marion's office to set up carotid ultrasound of neck next month. Return in 4 months for follow up, sooner if needed.

## 2013-07-24 NOTE — Assessment & Plan Note (Signed)
Chronic, stable. Continue meds.  Good control today.

## 2013-07-24 NOTE — Progress Notes (Signed)
Pre-visit discussion using our clinic review tool. No additional management support is needed unless otherwise documented below in the visit note.  

## 2013-07-24 NOTE — Assessment & Plan Note (Signed)
Encouraged pt and husband to ensure she took this medication - she has only taken 1 pill (50k u) in last 5 months.

## 2013-07-24 NOTE — Assessment & Plan Note (Signed)
Chronic, stable. Continue crestor.  

## 2013-07-24 NOTE — Assessment & Plan Note (Signed)
Stable. Recheck Cr today.

## 2013-07-24 NOTE — Assessment & Plan Note (Signed)
Stable.  Off aricept. Continue namenda, increase to XR 14mg  daily.

## 2013-07-25 LAB — RENAL FUNCTION PANEL
BUN: 42 mg/dL — ABNORMAL HIGH (ref 6–23)
CO2: 25 mEq/L (ref 19–32)
Creatinine, Ser: 1.7 mg/dL — ABNORMAL HIGH (ref 0.4–1.2)
GFR: 30.78 mL/min — ABNORMAL LOW (ref >60.00)
Glucose, Bld: 99 mg/dL (ref 70–99)
Phosphorus: 4.2 mg/dL (ref 2.3–4.6)
Potassium: 4.7 mEq/L (ref 3.5–5.1)
Sodium: 141 mEq/L (ref 135–145)

## 2013-07-25 NOTE — Addendum Note (Signed)
Addended by: Eustaquio Boyden on: 07/25/2013 05:14 PM   Modules accepted: Orders

## 2013-07-31 ENCOUNTER — Encounter: Payer: Self-pay | Admitting: *Deleted

## 2013-08-22 ENCOUNTER — Encounter (INDEPENDENT_AMBULATORY_CARE_PROVIDER_SITE_OTHER): Payer: Medicare HMO

## 2013-08-22 DIAGNOSIS — I6529 Occlusion and stenosis of unspecified carotid artery: Secondary | ICD-10-CM

## 2013-08-24 ENCOUNTER — Encounter: Payer: Self-pay | Admitting: Family Medicine

## 2013-08-25 ENCOUNTER — Encounter: Payer: Self-pay | Admitting: *Deleted

## 2013-09-11 ENCOUNTER — Other Ambulatory Visit: Payer: Self-pay | Admitting: *Deleted

## 2013-09-11 MED ORDER — LISINOPRIL 20 MG PO TABS: 20.0000 mg | ORAL_TABLET | Freq: Every day | ORAL | Status: DC

## 2013-11-23 ENCOUNTER — Ambulatory Visit: Payer: Medicare Other | Admitting: Family Medicine

## 2013-11-28 ENCOUNTER — Ambulatory Visit (INDEPENDENT_AMBULATORY_CARE_PROVIDER_SITE_OTHER): Payer: Medicare HMO | Admitting: Family Medicine

## 2013-11-28 ENCOUNTER — Encounter: Payer: Self-pay | Admitting: Family Medicine

## 2013-11-28 VITALS — BP 200/100 | HR 72 | Temp 97.3°F | Wt 150.2 lb

## 2013-11-28 DIAGNOSIS — I10 Essential (primary) hypertension: Secondary | ICD-10-CM

## 2013-11-28 DIAGNOSIS — N184 Chronic kidney disease, stage 4 (severe): Secondary | ICD-10-CM

## 2013-11-28 DIAGNOSIS — F0390 Unspecified dementia without behavioral disturbance: Secondary | ICD-10-CM

## 2013-11-28 DIAGNOSIS — E538 Deficiency of other specified B group vitamins: Secondary | ICD-10-CM

## 2013-11-28 LAB — POCT URINALYSIS DIPSTICK
Bilirubin, UA: NEGATIVE
GLUCOSE UA: NEGATIVE
Ketones, UA: NEGATIVE
NITRITE UA: NEGATIVE
PROTEIN UA: NEGATIVE
SPEC GRAV UA: 1.01
Urobilinogen, UA: 0.2
pH, UA: 6

## 2013-11-28 LAB — CBC WITH DIFFERENTIAL/PLATELET
BASOS PCT: 0.4 % (ref 0.0–3.0)
Basophils Absolute: 0 10*3/uL (ref 0.0–0.1)
EOS ABS: 0.1 10*3/uL (ref 0.0–0.7)
Eosinophils Relative: 1.7 % (ref 0.0–5.0)
HCT: 38.2 % (ref 36.0–46.0)
Hemoglobin: 12.6 g/dL (ref 12.0–15.0)
LYMPHS PCT: 38.2 % (ref 12.0–46.0)
Lymphs Abs: 2 10*3/uL (ref 0.7–4.0)
MCHC: 32.9 g/dL (ref 30.0–36.0)
MCV: 87.5 fl (ref 78.0–100.0)
Monocytes Absolute: 0.5 10*3/uL (ref 0.1–1.0)
Monocytes Relative: 9.2 % (ref 3.0–12.0)
NEUTROS ABS: 2.7 10*3/uL (ref 1.4–7.7)
Neutrophils Relative %: 50.5 % (ref 43.0–77.0)
Platelets: 161 10*3/uL (ref 150.0–400.0)
RBC: 4.37 Mil/uL (ref 3.87–5.11)
RDW: 14.2 % (ref 11.5–14.6)
WBC: 5.3 10*3/uL (ref 4.5–10.5)

## 2013-11-28 LAB — RENAL FUNCTION PANEL
ALBUMIN: 4.1 g/dL (ref 3.5–5.2)
BUN: 33 mg/dL — ABNORMAL HIGH (ref 6–23)
CALCIUM: 9.4 mg/dL (ref 8.4–10.5)
CHLORIDE: 106 meq/L (ref 96–112)
CO2: 26 mEq/L (ref 19–32)
Creatinine, Ser: 1.3 mg/dL — ABNORMAL HIGH (ref 0.4–1.2)
GFR: 39.38 mL/min — ABNORMAL LOW (ref >60.00)
Glucose, Bld: 93 mg/dL (ref 70–99)
PHOSPHORUS: 3.1 mg/dL (ref 2.3–4.6)
POTASSIUM: 4.3 meq/L (ref 3.5–5.1)
Sodium: 139 mEq/L (ref 135–145)

## 2013-11-28 MED ORDER — CYANOCOBALAMIN 1000 MCG/ML IJ SOLN
1000.0000 ug | Freq: Once | INTRAMUSCULAR | Status: AC
  Administered 2013-11-28: 1000 ug via INTRAMUSCULAR

## 2013-11-28 NOTE — Assessment & Plan Note (Signed)
B12 shot today. 

## 2013-11-28 NOTE — Assessment & Plan Note (Signed)
Concern for not taking meds today. Discussed weekly pill box use and niece will buy today. Encouraged restart all bp meds.  H/o stroke.  Continue aspirin 81mg  daily.

## 2013-11-28 NOTE — Assessment & Plan Note (Signed)
Niece concerned with sudden deterioration over last month, but this correlates to more extensive time spent with her aunt as well as husband's recent stressful hospitalization and change in pt's routine. I do think this dementia is consistent with alzheimer type dementia however does have h/o remote stroke on recent CT head. Encouraged restart namenda 14mg  XR daily and discussed pill box use. Discussed home safety.  Husband does not want to consider ALF. Will check CBC, renal panel today as well as UA to eval for other cause of recent deterioration.

## 2013-11-28 NOTE — Progress Notes (Signed)
Pre visit review using our clinic review tool, if applicable. No additional management support is needed unless otherwise documented below in the visit note. 

## 2013-11-28 NOTE — Patient Instructions (Addendum)
B12 shot today. Blood work today. Start using a weekly pill box.  Keep an eye on your blood pressure at home and let me know if too low <110 or too high >160 (call me in 1-2 weeks with an update). Restart taking your blood pressure medicines as up to now. Return to see me in 1 month, sooner if needed.

## 2013-11-28 NOTE — Progress Notes (Signed)
BP 200/100  Pulse 72  Temp(Src) 97.3 F (36.3 C) (Oral)  Wt 150 lb 4 oz (68.153 kg)   CC: 4 mo f/u  Subjective:    Patient ID: Angela Fuentes, female    DOB: 03/02/1921, 80 y.o.   MRN: [id number]  HPI: Angela Fuentes is a 80 y.o. female presenting on 11/28/2013 for Follow-up   Presents with Angela Fuentes niece today.  Husband is sitting in waiting room.  Niece endorses increased mood swings. Forgot who husband is.  At times forgets how to get into and out of car.  Deterioration noted over last 1 month with increased stress of husband Angela Fuentes who has been in hospital recently - with kidney stone.  Niece thinks there was a sudden deterioration about 1 mo ago.  No obvious stroke sxs however.  H/o remote stroke on recent CT head.  Niece concerned pt has not been taking any of her meds for the last week.  Did not bring meds today.  Husband had just been asking her if she took her daily meds to which her response was always yes.  Dementia - no improvement with aricept so this was stopped. More trouble at home. Mainly eats out.  Husband doesn't let her cook anymore.  Husband pays bills. Husband drives. Husband buys groceries. Has not been taking namenda - forgetful.  Denies dysuria, denies significant balance trouble  Relevant past medical, surgical, family and social history reviewed and updated as indicated. Allergies and medications reviewed and updated. Current Outpatient Prescriptions on File Prior to Visit  Medication Sig  . amLODipine (NORVASC) 10 MG tablet Take 1 tablet (10 mg total) by mouth daily.  Marland Kitchen. aspirin 81 MG tablet Take 81 mg by mouth daily.    Marland Kitchen. atenolol (TENORMIN) 25 MG tablet Take 1 tablet (25 mg total) by mouth daily.  . cyanocobalamin (,VITAMIN B-12,) 1000 MCG/ML injection Inject 1 mL (1,000 mcg total) into the muscle every 30 (thirty) days. Start 04/2012  . ergocalciferol (VITAMIN D2) 50000 UNITS capsule Take 1 capsule (50,000 Units total) by mouth once a week.  Marland Kitchen.  lisinopril (PRINIVIL,ZESTRIL) 20 MG tablet Take 1 tablet (20 mg total) by mouth daily.  . Memantine HCl ER 14 MG CP24 Take 1 tablet (14 mg total) by mouth daily.  . rosuvastatin (CRESTOR) 20 MG tablet Take 1 tablet (20 mg total) by mouth at bedtime.  . triamterene-hydrochlorothiazide (MAXZIDE-25) 37.5-25 MG per tablet Take 1 tablet by mouth daily.   No current facility-administered medications on file prior to visit.    Review of Systems Per HPI unless specifically indicated above    Objective:    BP 200/100  Pulse 72  Temp(Src) 97.3 F (36.3 C) (Oral)  Wt 150 lb 4 oz (68.153 kg)  Physical Exam  Nursing note and vitals reviewed. Constitutional: She appears well-developed and well-nourished. No distress.  HENT:  Mouth/Throat: Oropharynx is clear and moist. No oropharyngeal exudate.  Neck: Normal range of motion. Neck supple.  Cardiovascular: Normal rate, regular rhythm, normal heart sounds and intact distal pulses.   No murmur heard. Pulmonary/Chest: Effort normal and breath sounds normal. No respiratory distress. She has no wheezes. She has no rales.  Musculoskeletal: She exhibits no edema.  Neurological: She has normal strength. No sensory deficit.  Tongue protrusion intact CN grossly intact grip strength bilaterally intact   Skin: Skin is warm and dry. No rash noted.   Results for orders placed in visit on 11/28/13  POCT URINALYSIS DIPSTICK  Result Value Ref Range   Color, UA yellow     Clarity, UA hazy     Glucose, UA negative     Bilirubin, UA negative     Ketones, UA negative     Spec Grav, UA 1.010     Blood, UA trace     pH, UA 6.0     Protein, UA negative     Urobilinogen, UA 0.2     Nitrite, UA negative     Leukocytes, UA small (1+)        Assessment & Plan:   Problem List Items Addressed This Visit   HYPERTENSION     Concern for not taking meds today. Discussed weekly pill box use and niece will buy today. Encouraged restart all bp meds.  H/o  stroke.  Continue aspirin 81mg  daily.    CKD (chronic kidney disease) stage 4, GFR 15-29 ml/min   Relevant Orders      Renal function panel      CBC with Differential      POCT urinalysis dipstick (Completed)   Dementia - Primary     Niece concerned with sudden deterioration over last month, but this correlates to more extensive time spent with her aunt as well as husband's recent stressful hospitalization and change in pt's routine. I do think this dementia is consistent with alzheimer type dementia however does have h/o remote stroke on recent CT head. Encouraged restart namenda 14mg  XR daily and discussed pill box use. Discussed home safety.  Husband does not want to consider ALF. Will check CBC, renal panel today as well as UA to eval for other cause of recent deterioration.    B12 deficiency     B12 shot today.    Relevant Medications      cyanocobalamin ((VITAMIN B-12)) injection 1,000 mcg (Completed)       Follow up plan: Return in about 1 month (around 12/28/2013), or as needed, for follow up.

## 2013-11-29 ENCOUNTER — Telehealth: Payer: Self-pay | Admitting: Family Medicine

## 2013-11-29 NOTE — Telephone Encounter (Signed)
Relevant patient education mailed to patient.  

## 2013-11-30 ENCOUNTER — Encounter: Payer: Self-pay | Admitting: *Deleted

## 2013-12-26 ENCOUNTER — Encounter: Payer: Self-pay | Admitting: Family Medicine

## 2013-12-26 ENCOUNTER — Ambulatory Visit (INDEPENDENT_AMBULATORY_CARE_PROVIDER_SITE_OTHER): Payer: Commercial Managed Care - HMO | Admitting: Family Medicine

## 2013-12-26 VITALS — BP 150/70 | HR 60 | Temp 98.1°F | Wt 151.0 lb

## 2013-12-26 DIAGNOSIS — F0280 Dementia in other diseases classified elsewhere without behavioral disturbance: Secondary | ICD-10-CM

## 2013-12-26 DIAGNOSIS — E559 Vitamin D deficiency, unspecified: Secondary | ICD-10-CM

## 2013-12-26 DIAGNOSIS — E538 Deficiency of other specified B group vitamins: Secondary | ICD-10-CM

## 2013-12-26 DIAGNOSIS — I10 Essential (primary) hypertension: Secondary | ICD-10-CM

## 2013-12-26 DIAGNOSIS — N184 Chronic kidney disease, stage 4 (severe): Secondary | ICD-10-CM

## 2013-12-26 DIAGNOSIS — G309 Alzheimer's disease, unspecified: Secondary | ICD-10-CM

## 2013-12-26 MED ORDER — DONEPEZIL HCL 5 MG PO TABS: 5.0000 mg | ORAL_TABLET | Freq: Every day | ORAL | Status: DC

## 2013-12-26 NOTE — Assessment & Plan Note (Signed)
2d early for B12 shot today. Did not provide.

## 2013-12-26 NOTE — Assessment & Plan Note (Signed)
Anticipate Alz dementia, ?vascular dementia as well given risk factors. H/o remote stroke on recent head CT. On namenda 14mg  XR daily, will do another trial of aricept 5mg  daily - discussed monitoring for nausea. Overall doing better with niece more closely monitoring meds and using pill box.

## 2013-12-26 NOTE — Progress Notes (Signed)
Pre visit review using our clinic review tool, if applicable. No additional management support is needed unless otherwise documented below in the visit note. 

## 2013-12-26 NOTE — Assessment & Plan Note (Signed)
Continue weekly supplemnet.

## 2013-12-26 NOTE — Assessment & Plan Note (Signed)
Chronic, stable. Continue meds.  Above goal in CKD, but no changes today. Check blood work next visit.

## 2013-12-26 NOTE — Assessment & Plan Note (Signed)
Chronic, stable. Continue meds. Check Cr next visit.

## 2013-12-26 NOTE — Patient Instructions (Addendum)
We're too early for B12 shot today. Let's do another trial of aricept - to see if we can have more better days than worse days. Watch for nausea with this medicine. Continue other medicines as up to now. Return to see me in 3 months for follow up ,sooner if needed.  Alzheimer Disease Alzheimer Disease (AD) is a mental disorder. It causes memory loss and loss of other mental functions, such as learning, thinking, solving problems, communicating, and completing tasks. The mental losses interfere with the ability to perform daily activities at work, at home, or in social situations. AD usually starts in the late 60s or early 100s but can start earlier in life (familial form). The mental changes caused by AD are permanent and worsen over time. As the illness progresses, the ability to do even the simplest things is lost. Survival with AD ranges from several years to as long as 20 years. CAUSES AD is caused by abnormally high levels of a protein (beta-amyloid) in the brain. This protein forms very small deposits within and around the brain's nerve cells. These deposits prevent the nerve cells from working properly. Experts are not certain what causes the beta-amyloid deposits in AD. RISK FACTORS The following major risk factors have been identified:  Increasing age.  Certain genetic variations, such as Down syndrome (trisomy 21). SYMPTOMS The earliest mental change in AD is mild memory loss of recent events, names, or phone numbers. Other symptoms at the beginning of AD include loss of objects, minor loss of vocabulary, and difficulty with complex tasks, such as paying bills or driving in unfamiliar locations. At this stage, you are still able to perform daily activities but need greater effort, more time, or memory aids. Other mental functions deteriorate as AD worsens. These changes slowly go from mild to severe. Symptoms at this stage include:  Difficulty remembering You may not be able to recall  personal information such as your address and telephone number. You may become confused about the date, the season of the year, or your location.  Difficulty maintaining attention You may forget what you wanted to say during conversations and repeat what you have already said.  Difficulty learning new information or tasks You may not remember what you read or the name of a new friend you met.  Difficulty counting or doing math You may have difficulty with complex math problems. You may make mistakes in paying bills or managing your checkbook.  Poor reasoning and judgment You may make poor decisions or not dress right for the weather.  Difficulty communicating You may have regular difficulty remembering words, naming objects, expressing yourself clearly, or writing sentences that make sense.  Difficulty performing familiar daily activities You may get lost driving in familiar locations or need help eating, bathing, dressing, grooming, or using the toilet. You may have difficulty maintaining bladder or bowel control.  Difficulty recognizing familiar faces You may confuse family members or close friends with one another. You may not recognize a close relative or may mistake strangers for family. AD also may cause changes in personality and behavior. These changes include loss of interest or motivation, social withdrawal, anxiety, difficulty sleeping, uncharacteristic anger or combativeness, a false belief that someone is trying to harm you (paranoia), seeing things that are not real (hallucinations), or agitation. Confusion and disruptive behavior are often worse at night and may be triggered by changes in the environment or acute medical issues. DIAGNOSIS  AD is diagnosed through an assessment by your health  care provider. During this assessment, your health care provider will do the following:  Ask you and your family, friends, or caregiver questions about your symptoms, their frequency, their  duration and progression, and the effect they are having on your life.  Ask questions about your personal and family medical history and use of alcohol or drugs, including prescription medicine.  Perform a physical exam and order blood tests and brain imaging exams. Your health care provider may refer you to a specialist for detailed evaluation of your mental functions (neuropsychological testing).  Many different brain disorders, medical conditions, and certain substances can cause symptoms that resemble AD symptoms. These must be ruled out before AD can be diagnosed. If AD is diagnosed, it will be considered either "possible" or "probable" AD. "Possible" AD means that your symptoms are typical of AD and no other disorder is causing them. "Probable" AD means that you also have a family history of AD or genetic test results that support the diagnosis. Certain tests, mostly used in research studies, are highly specific for AD.  TREATMENT  There is currently no cure for AD. The goals of treatment are to:  Slow down the progression of the disease.  Preserve mental function as long as possible.  Manage behavioral symptoms.  Make life easier for the person with AD and their caregivers. The following treatment options are available:  Medicine Certain medicines may help slow memory loss by changing the level of certain chemicals in the brain. Medicine may also help with behavioral symptoms.  Talk therapy Talk therapy provides education, support, and memory aids for people with AD. It is most effective in the early stages of the illness.  Caregiving Caregivers may be family members, friends, or trained medical professionals. They help the person with AD with daily life activities. Caregiving may take place at home or at a nursing facility.  Family support groups These provide education, emotional support, and information about community resources to family members who are taking care of the person  with AD. Document Released: 03/31/2004 Document Revised: 03/22/2013 Document Reviewed: 11/25/2012 [facility] Patient Information 2014 [place], [place].

## 2013-12-26 NOTE — Progress Notes (Signed)
BP 150/70  Pulse 60  Temp(Src) 98.1 F (36.7 C) (Oral)  Wt 151 lb (68.493 kg)   CC: 1 mo f/u  Subjective:    Patient ID: Angela Fuentes, female    DOB: 05/17/1921, 80 y.o.   MRN: [id number]  HPI: Angela Fuentes is a 80 y.o. female presenting on 12/26/2013 for Follow-up   See prior note for details.  Presents with niece Lattie Corns today for f/u. Using pillbox - this helps.  Has restarted regularly taking namenda XR. No visual hallucinations.  Occasional auditory hallucinations. + imbalance and falls - especially when first stands up. No significant incontinence.  No HA, vision changes, CP/tightness, SOB, leg swelling.  H/o resistant HTN and CKD stage 3-4.  Relevant past medical, surgical, family and social history reviewed and updated as indicated.  Allergies and medications reviewed and updated. Current Outpatient Prescriptions on File Prior to Visit  Medication Sig  . amLODipine (NORVASC) 10 MG tablet Take 1 tablet (10 mg total) by mouth daily.  Marland Kitchen aspirin 81 MG tablet Take 81 mg by mouth daily.    Marland Kitchen atenolol (TENORMIN) 25 MG tablet Take 1 tablet (25 mg total) by mouth daily.  . cyanocobalamin (,VITAMIN B-12,) 1000 MCG/ML injection Inject 1 mL (1,000 mcg total) into the muscle every 30 (thirty) days. Start 04/2012  . ergocalciferol (VITAMIN D2) 50000 UNITS capsule Take 1 capsule (50,000 Units total) by mouth once a week.  Marland Kitchen lisinopril (PRINIVIL,ZESTRIL) 20 MG tablet Take 1 tablet (20 mg total) by mouth daily.  . Memantine HCl ER 14 MG CP24 Take 1 tablet (14 mg total) by mouth daily.  . rosuvastatin (CRESTOR) 20 MG tablet Take 1 tablet (20 mg total) by mouth at bedtime.  . triamterene-hydrochlorothiazide (MAXZIDE-25) 37.5-25 MG per tablet Take 1 tablet by mouth daily.   No current facility-administered medications on file prior to visit.    Review of Systems Per HPI unless specifically indicated above    Objective:    BP 150/70  Pulse 60  Temp(Src) 98.1 F (36.7  C) (Oral)  Wt 151 lb (68.493 kg)  Physical Exam  Nursing note and vitals reviewed. Constitutional: She appears well-developed and well-nourished. No distress.  HENT:  Mouth/Throat: Oropharynx is clear and moist. No oropharyngeal exudate.  Cardiovascular: Normal rate, regular rhythm, normal heart sounds and intact distal pulses.   No murmur heard. Pulmonary/Chest: Effort normal and breath sounds normal. No respiratory distress. She has no wheezes. She has no rales.  Musculoskeletal: She exhibits no edema.  Skin: Skin is warm and dry. No rash noted.   Results for orders placed in visit on 11/28/13  RENAL FUNCTION PANEL      Result Value Ref Range   Sodium 139  135 - 145 mEq/L   Potassium 4.3  3.5 - 5.1 mEq/L   Chloride 106  96 - 112 mEq/L   CO2 26  19 - 32 mEq/L   Calcium 9.4  8.4 - 10.5 mg/dL   Albumin 4.1  3.5 - 5.2 g/dL   BUN 33 (*) 6 - 23 mg/dL   Creatinine, Ser 1.3 (*) 0.4 - 1.2 mg/dL   Glucose, Bld 93  70 - 99 mg/dL   Phosphorus 3.1  2.3 - 4.6 mg/dL   GFR 85.46 (*) >27.03 mL/min  CBC WITH DIFFERENTIAL      Result Value Ref Range   WBC 5.3  4.5 - 10.5 K/uL   RBC 4.37  3.87 - 5.11 Mil/uL   Hemoglobin 12.6  12.0 -  15.0 g/dL   HCT 96.038.2  45.436.0 - 09.846.0 %   MCV 87.5  78.0 - 100.0 fl   MCHC 32.9  30.0 - 36.0 g/dL   RDW 11.914.2  14.711.5 - 82.914.6 %   Platelets 161.0  150.0 - 400.0 K/uL   Neutrophils Relative % 50.5  43.0 - 77.0 %   Lymphocytes Relative 38.2  12.0 - 46.0 %   Monocytes Relative 9.2  3.0 - 12.0 %   Eosinophils Relative 1.7  0.0 - 5.0 %   Basophils Relative 0.4  0.0 - 3.0 %   Neutro Abs 2.7  1.4 - 7.7 K/uL   Lymphs Abs 2.0  0.7 - 4.0 K/uL   Monocytes Absolute 0.5  0.1 - 1.0 K/uL   Eosinophils Absolute 0.1  0.0 - 0.7 K/uL   Basophils Absolute 0.0  0.0 - 0.1 K/uL  POCT URINALYSIS DIPSTICK      Result Value Ref Range   Color, UA yellow     Clarity, UA hazy     Glucose, UA negative     Bilirubin, UA negative     Ketones, UA negative     Spec Grav, UA 1.010     Blood,  UA trace     pH, UA 6.0     Protein, UA negative     Urobilinogen, UA 0.2     Nitrite, UA negative     Leukocytes, UA small (1+)        Assessment & Plan:   Problem List Items Addressed This Visit   Vitamin D deficiency     Continue weekly supplemnet.    HYPERTENSION     Chronic, stable. Continue meds.  Above goal in CKD, but no changes today. Check blood work next visit.    CKD (chronic kidney disease) stage 4, GFR 15-29 ml/min     Chronic, stable. Continue meds. Check Cr next visit.    B12 deficiency - Primary     2d early for B12 shot today. Did not provide.    Alzheimer's disease     Anticipate Alz dementia, ?vascular dementia as well given risk factors. H/o remote stroke on recent head CT. On namenda 14mg  XR daily, will do another trial of aricept 5mg  daily - discussed monitoring for nausea. Overall doing better with niece more closely monitoring meds and using pill box.    Relevant Medications      donepezil (ARICEPT) tablet       Follow up plan: Return in about 3 months (around 03/28/2014), or as needed, for follow up visit.

## 2014-01-15 ENCOUNTER — Other Ambulatory Visit: Payer: Self-pay | Admitting: *Deleted

## 2014-01-15 MED ORDER — MEMANTINE HCL ER 14 MG PO CP24: 14.0000 mg | ORAL_CAPSULE | Freq: Every day | ORAL | Status: DC

## 2014-03-16 ENCOUNTER — Telehealth: Payer: Self-pay | Admitting: *Deleted

## 2014-03-16 NOTE — Telephone Encounter (Signed)
Lm w/ sister to have her call our office.  Needs to schedule a carotid doppler (6 mth f/u).

## 2014-03-21 ENCOUNTER — Other Ambulatory Visit: Payer: Self-pay | Admitting: *Deleted

## 2014-03-21 MED ORDER — AMLODIPINE BESYLATE 10 MG PO TABS: 10.0000 mg | ORAL_TABLET | Freq: Every day | ORAL | Status: DC

## 2014-04-02 ENCOUNTER — Ambulatory Visit (INDEPENDENT_AMBULATORY_CARE_PROVIDER_SITE_OTHER): Payer: Commercial Managed Care - HMO | Admitting: Family Medicine

## 2014-04-02 ENCOUNTER — Encounter: Payer: Self-pay | Admitting: Family Medicine

## 2014-04-02 VITALS — BP 140/70 | HR 68 | Temp 97.3°F | Wt 152.5 lb

## 2014-04-02 DIAGNOSIS — F0280 Dementia in other diseases classified elsewhere without behavioral disturbance: Secondary | ICD-10-CM

## 2014-04-02 DIAGNOSIS — N184 Chronic kidney disease, stage 4 (severe): Secondary | ICD-10-CM

## 2014-04-02 DIAGNOSIS — E538 Deficiency of other specified B group vitamins: Secondary | ICD-10-CM

## 2014-04-02 DIAGNOSIS — I10 Essential (primary) hypertension: Secondary | ICD-10-CM

## 2014-04-02 DIAGNOSIS — Z23 Encounter for immunization: Secondary | ICD-10-CM

## 2014-04-02 DIAGNOSIS — G309 Alzheimer's disease, unspecified: Secondary | ICD-10-CM

## 2014-04-02 DIAGNOSIS — E785 Hyperlipidemia, unspecified: Secondary | ICD-10-CM

## 2014-04-02 DIAGNOSIS — I6529 Occlusion and stenosis of unspecified carotid artery: Secondary | ICD-10-CM

## 2014-04-02 MED ORDER — CYANOCOBALAMIN 1000 MCG/ML IJ SOLN
1000.0000 ug | Freq: Once | INTRAMUSCULAR | Status: AC
  Administered 2014-04-02: 1000 ug via INTRAMUSCULAR

## 2014-04-02 NOTE — Assessment & Plan Note (Signed)
b12 shot today - has not had in months. Will need B12 level next labwork.

## 2014-04-02 NOTE — Assessment & Plan Note (Signed)
Did not check today. Will check at next visit.

## 2014-04-02 NOTE — Progress Notes (Signed)
BP 140/70  Pulse 68  Temp(Src) 97.3 F (36.3 C) (Oral)  Wt 152 lb 8 oz (69.174 kg)   CC: 3 mo f/u  Subjective:    Patient ID: Angela Fuentes, female    DOB: 12/25/1920, 80 y.o.   MRN: [id number]  HPI: Angela Fuentes is a 80 y.o. female presenting on 04/02/2014 for Follow-up   Presents with niece Lattie Corns today for f/u. Using pillbox - this helps.  H/o resistant HTN and CKD stage 3-4.  H/o dementia - thought alz and vascular dementia combination. Has regularly been taking namenda XR and last visit we started aricept  daily, tolerating well. Good days and bad days. No visual hallucinations. Occasional auditory hallucinations. Occasional confusion - confuses husband for father. + imbalance and falls - especially when first stands up. No significant incontinence.   HTN - tolerating bp regimen well. No HA, vision changes, CP/tightness, SOB, leg swelling. No hypotensive sxs or episodes  Niece worried husband may want her to drive in February. Also worries because at times when she visits finds patient at stove trying to remember how to cook - and she's forgotten.   Lab Results  Component Value Date   VITAMINB12 247 02/25/2012    Relevant past medical, surgical, family and social history reviewed and updated as indicated.  Allergies and medications reviewed and updated. Current Outpatient Prescriptions on File Prior to Visit  Medication Sig  . amLODipine (NORVASC) 10 MG tablet Take 1 tablet (10 mg total) by mouth daily.  Marland Kitchen aspirin 81 MG tablet Take 81 mg by mouth daily.    Marland Kitchen atenolol (TENORMIN) 25 MG tablet Take 1 tablet (25 mg total) by mouth daily.  . cyanocobalamin (,VITAMIN B-12,) 1000 MCG/ML injection Inject 1 mL (1,000 mcg total) into the muscle every 30 (thirty) days. Start 04/2012  . donepezil (ARICEPT) 5 MG tablet Take 1 tablet (5 mg total) by mouth at bedtime.  Marland Kitchen lisinopril (PRINIVIL,ZESTRIL) 20 MG tablet Take 1 tablet (20 mg total) by mouth daily.  . Memantine HCl  ER 14 MG CP24 Take 1 tablet (14 mg total) by mouth daily.  . rosuvastatin (CRESTOR) 20 MG tablet Take 1 tablet (20 mg total) by mouth at bedtime.  . triamterene-hydrochlorothiazide (MAXZIDE-25) 37.5-25 MG per tablet Take 1 tablet by mouth daily.   No current facility-administered medications on file prior to visit.    Review of Systems Per HPI unless specifically indicated above    Objective:    BP 140/70  Pulse 68  Temp(Src) 97.3 F (36.3 C) (Oral)  Wt 152 lb 8 oz (69.174 kg)  Physical Exam  Nursing note and vitals reviewed. Constitutional: She appears well-developed and well-nourished. No distress.  HENT:  Mouth/Throat: Oropharynx is clear and moist. No oropharyngeal exudate.  Eyes: Conjunctivae and EOM are normal. Pupils are equal, round, and reactive to light.  Neck: Normal range of motion. Neck supple.  Cardiovascular: Normal rate, regular rhythm, normal heart sounds and intact distal pulses.   No murmur heard. Pulmonary/Chest: Effort normal and breath sounds normal. No respiratory distress. She has no wheezes. She has no rales.  Musculoskeletal: She exhibits no edema.  Lymphadenopathy:    She has no cervical adenopathy.  Skin: Skin is warm and dry. No rash noted.  Psychiatric: She has a normal mood and affect.  Pleasant but confabulates when discussing scratch on arm       Assessment & Plan:   Problem List Items Addressed This Visit   HYPERTENSION - Primary  Chronic, stable. Good control on current regimen. Blood work next visit. rtc 4 mo f/u.    HYPERLIPIDEMIA     Tolerates crestor well - continue    CKD (chronic kidney disease) stage 4, GFR 15-29 ml/min     Did not check today. Will check at next visit.    CAROTID ARTERY STENOSIS, BILATERAL     Will order next visit    B12 deficiency     b12 shot today - has not had in months. Will need B12 level next labwork.    Alzheimer's disease     Anticipate alz + vascular dementia. Tolerating aricept well as  well as namenda  XR daily. rec against cooking, driving.        Follow up plan: Return in about 4 months (around 08/02/2014), or as needed.

## 2014-04-02 NOTE — Patient Instructions (Addendum)
B12 shot today. Return monthly for B12 shots. Return in 4 months for follow up visit - we will check labwork at that time. Good to see you today! Continue medications as up to now. I don't recommend you driving as it's not safe.

## 2014-04-02 NOTE — Progress Notes (Signed)
Pre visit review using our clinic review tool, if applicable. No additional management support is needed unless otherwise documented below in the visit note. 

## 2014-04-02 NOTE — Assessment & Plan Note (Addendum)
Anticipate alz + vascular dementia. Tolerating aricept well as well as namenda  XR daily. rec against cooking, driving.

## 2014-04-02 NOTE — Assessment & Plan Note (Signed)
Tolerates crestor well - continue

## 2014-04-02 NOTE — Addendum Note (Signed)
Addended by: Josph Macho A on: 04/02/2014 12:02 PM   Modules accepted: Orders

## 2014-04-02 NOTE — Assessment & Plan Note (Signed)
Will order next visit.

## 2014-04-02 NOTE — Assessment & Plan Note (Signed)
Chronic, stable. Good control on current regimen. Blood work next visit. rtc 4 mo f/u.

## 2014-05-04 ENCOUNTER — Ambulatory Visit (INDEPENDENT_AMBULATORY_CARE_PROVIDER_SITE_OTHER): Payer: Commercial Managed Care - HMO

## 2014-05-04 DIAGNOSIS — E538 Deficiency of other specified B group vitamins: Secondary | ICD-10-CM

## 2014-05-04 MED ORDER — CYANOCOBALAMIN 1000 MCG/ML IJ SOLN
1000.0000 ug | Freq: Once | INTRAMUSCULAR | Status: AC
  Administered 2014-05-04: 1000 ug via INTRAMUSCULAR

## 2014-07-11 ENCOUNTER — Other Ambulatory Visit: Payer: Self-pay | Admitting: *Deleted

## 2014-07-11 MED ORDER — ATENOLOL 25 MG PO TABS: 25.0000 mg | ORAL_TABLET | Freq: Every day | ORAL | Status: DC

## 2014-07-11 MED ORDER — ROSUVASTATIN CALCIUM 20 MG PO TABS: 20.0000 mg | ORAL_TABLET | Freq: Every day | ORAL | Status: DC

## 2014-07-11 MED ORDER — TRIAMTERENE-HCTZ 37.5-25 MG PO TABS: 1.0000 | ORAL_TABLET | Freq: Every day | ORAL | Status: AC

## 2014-07-16 ENCOUNTER — Emergency Department: Payer: Self-pay | Admitting: Emergency Medicine

## 2014-07-16 LAB — URINALYSIS, COMPLETE
BILIRUBIN, UR: NEGATIVE
BLOOD: NEGATIVE
Glucose,UR: NEGATIVE mg/dL (ref 0–75)
Ketone: NEGATIVE
LEUKOCYTE ESTERASE: NEGATIVE
NITRITE: NEGATIVE
PH: 6 (ref 4.5–8.0)
Protein: NEGATIVE
SPECIFIC GRAVITY: 1.01 (ref 1.003–1.030)
Squamous Epithelial: NONE SEEN
WBC UR: 2 /HPF (ref 0–5)

## 2014-07-16 LAB — CBC
HCT: 39.2 % (ref 35.0–47.0)
HGB: 12.7 g/dL (ref 12.0–16.0)
MCH: 29.3 pg (ref 26.0–34.0)
MCHC: 32.4 g/dL (ref 32.0–36.0)
MCV: 91 fL (ref 80–100)
PLATELETS: 175 10*3/uL (ref 150–440)
RBC: 4.33 10*6/uL (ref 3.80–5.20)
RDW: 14.4 % (ref 11.5–14.5)
WBC: 7 10*3/uL (ref 3.6–11.0)

## 2014-07-16 LAB — COMPREHENSIVE METABOLIC PANEL
ALBUMIN: 3.6 g/dL (ref 3.4–5.0)
ALT: 18 U/L
AST: 17 U/L (ref 15–37)
Alkaline Phosphatase: 78 U/L
Anion Gap: 4 — ABNORMAL LOW (ref 7–16)
BILIRUBIN TOTAL: 0.8 mg/dL (ref 0.2–1.0)
BUN: 42 mg/dL — ABNORMAL HIGH (ref 7–18)
CALCIUM: 8.9 mg/dL (ref 8.5–10.1)
CHLORIDE: 107 mmol/L (ref 98–107)
CO2: 28 mmol/L (ref 21–32)
Creatinine: 1.74 mg/dL — ABNORMAL HIGH (ref 0.60–1.30)
EGFR (African American): 35 — ABNORMAL LOW
GFR CALC NON AF AMER: 29 — AB
Glucose: 94 mg/dL (ref 65–99)
Osmolality: 288 (ref 275–301)
Potassium: 4.4 mmol/L (ref 3.5–5.1)
SODIUM: 139 mmol/L (ref 136–145)
TOTAL PROTEIN: 6.6 g/dL (ref 6.4–8.2)

## 2014-07-16 LAB — CBC AND DIFFERENTIAL
HEMOGLOBIN: 12.7 g/dL (ref 12.0–16.0)
Platelets: 175 10*3/uL (ref 150–399)
WBC: 7 10^3/mL

## 2014-07-16 LAB — HEPATIC FUNCTION PANEL
ALK PHOS: 78 U/L (ref 25–125)
ALT: 18 U/L (ref 7–35)
AST: 17 U/L (ref 13–35)
BILIRUBIN, TOTAL: 0.8 mg/dL

## 2014-07-16 LAB — TROPONIN I: Troponin-I: <0.02 ng/mL

## 2014-07-16 LAB — BASIC METABOLIC PANEL
Creatinine: 1.7 mg/dL — AB (ref <=1.1)
GLUCOSE: 94 mg/dL
POTASSIUM: 4.4 mmol/L (ref 3.4–5.3)
Sodium: 139 mmol/L (ref 137–147)

## 2014-08-02 ENCOUNTER — Encounter: Payer: Self-pay | Admitting: Family Medicine

## 2014-08-02 ENCOUNTER — Ambulatory Visit (INDEPENDENT_AMBULATORY_CARE_PROVIDER_SITE_OTHER): Payer: Commercial Managed Care - HMO | Admitting: Family Medicine

## 2014-08-02 VITALS — BP 160/60 | HR 80 | Temp 98.0°F | Wt 153.8 lb

## 2014-08-02 DIAGNOSIS — I129 Hypertensive chronic kidney disease with stage 1 through stage 4 chronic kidney disease, or unspecified chronic kidney disease: Secondary | ICD-10-CM

## 2014-08-02 DIAGNOSIS — F0390 Unspecified dementia without behavioral disturbance: Secondary | ICD-10-CM

## 2014-08-02 DIAGNOSIS — E538 Deficiency of other specified B group vitamins: Secondary | ICD-10-CM

## 2014-08-02 DIAGNOSIS — F03B0 Unspecified dementia, moderate, without behavioral disturbance, psychotic disturbance, mood disturbance, and anxiety: Secondary | ICD-10-CM

## 2014-08-02 DIAGNOSIS — N184 Chronic kidney disease, stage 4 (severe): Secondary | ICD-10-CM

## 2014-08-02 MED ORDER — CYANOCOBALAMIN 1000 MCG/ML IJ SOLN
1000.0000 ug | Freq: Once | INTRAMUSCULAR | Status: AC
  Administered 2014-08-02: 1000 ug via INTRAMUSCULAR

## 2014-08-02 NOTE — Progress Notes (Addendum)
BP 160/60 mmHg  Pulse 80  Temp(Src) 98 F (36.7 C) (Oral)  Wt 153 lb 12 oz (69.741 kg)   CC: 4 mo f/u visit  Subjective:    Patient ID: Angela RileyChristine S Fuentes, female    DOB: 04/19/1921, 80 y.o.   MRN: [id number]  HPI: Angela RileyChristine S Fuentes is a 80 y.o. female presenting on 08/02/2014 for Follow-up   Presents with niece [name] today for f/u.  Fell 1 wk ago.  Seen at ER 2 wks ago after syncopal episode attributed to dehydration. Rest of workup unrevealing. Doesn't like to drink water. I have requested records to review. - see addendum  H/o resistant HTN and CKD stage 3-4. No HA, vision changes, CP/tightness, SOB, leg swelling. Still using pill box.   H/o dementia - thought alz and vascular dementia combination. Has regularly been taking namenda XR and last visit we started aricept 5mg  daily, tolerating well. Worsening memory. No visual hallucinations. Occasional auditory hallucinations. Occasional confusion - confuses husband for father. + imbalance and falls - especially when first stands up. No significant incontinence.   Relevant past medical, surgical, family and social history reviewed and updated as indicated. Interim medical history since our last visit reviewed. Allergies and medications reviewed and updated.  Current Outpatient Prescriptions on File Prior to Visit  Medication Sig  . amLODipine (NORVASC) 10 MG tablet Take 1 tablet (10 mg total) by mouth daily.  Marland Kitchen. aspirin 81 MG tablet Take 81 mg by mouth daily.    Marland Kitchen. atenolol (TENORMIN) 25 MG tablet Take 1 tablet (25 mg total) by mouth daily.  . cyanocobalamin (,VITAMIN B-12,) 1000 MCG/ML injection Inject 1 mL (1,000 mcg total) into the muscle every 30 (thirty) days. Start 04/2012  . donepezil (ARICEPT) 5 MG tablet Take 1 tablet (5 mg total) by mouth at bedtime.  Marland Kitchen. lisinopril (PRINIVIL,ZESTRIL) 20 MG tablet Take 1 tablet (20 mg total) by mouth daily.  . Memantine HCl ER 14 MG CP24 Take 1 tablet (14 mg total) by mouth daily.  .  rosuvastatin (CRESTOR) 20 MG tablet Take 1 tablet (20 mg total) by mouth at bedtime.  . triamterene-hydrochlorothiazide (MAXZIDE-25) 37.5-25 MG per tablet Take 1 tablet by mouth daily.   No current facility-administered medications on file prior to visit.    Review of Systems Per HPI unless specifically indicated above     Objective:    BP 160/60 mmHg  Pulse 80  Temp(Src) 98 F (36.7 C) (Oral)  Wt 153 lb 12 oz (69.741 kg)  Wt Readings from Last 3 Encounters:  08/02/14 153 lb 12 oz (69.741 kg)  04/02/14 152 lb 8 oz (69.174 kg)  12/26/13 151 lb (68.493 kg)    Physical Exam  Constitutional: She appears well-developed and well-nourished. No distress.  HENT:  Head: Normocephalic and atraumatic.  Mouth/Throat: Oropharynx is clear and moist. No oropharyngeal exudate.  Neck: No thyromegaly present.  Cardiovascular: Normal rate, regular rhythm, normal heart sounds and intact distal pulses.   No murmur heard. Pulmonary/Chest: Effort normal and breath sounds normal. No respiratory distress. She has no wheezes. She has no rales.  Musculoskeletal: She exhibits no edema.  Nursing note and vitals reviewed.  Results for orders placed or performed in visit on 11/28/13  Renal function panel  Result Value Ref Range   Sodium 139 135 - 145 mEq/L   Potassium 4.3 3.5 - 5.1 mEq/L   Chloride 106 96 - 112 mEq/L   CO2 26 19 - 32 mEq/L   Calcium 9.4 8.4 -  10.5 mg/dL   Albumin 4.1 3.5 - 5.2 g/dL   BUN 33 (H) 6 - 23 mg/dL   Creatinine, Ser 1.3 (H) 0.4 - 1.2 mg/dL   Glucose, Bld 93 70 - 99 mg/dL   Phosphorus 3.1 2.3 - 4.6 mg/dL   GFR 56.2139.38 (L) >30.86>60.00 mL/min  CBC with Differential  Result Value Ref Range   WBC 5.3 4.5 - 10.5 K/uL   RBC 4.37 3.87 - 5.11 Mil/uL   Hemoglobin 12.6 12.0 - 15.0 g/dL   HCT 57.838.2 46.936.0 - 62.946.0 %   MCV 87.5 78.0 - 100.0 fl   MCHC 32.9 30.0 - 36.0 g/dL   RDW 52.814.2 41.311.5 - 24.414.6 %   Platelets 161.0 150.0 - 400.0 K/uL   Neutrophils Relative % 50.5 43.0 - 77.0 %    Lymphocytes Relative 38.2 12.0 - 46.0 %   Monocytes Relative 9.2 3.0 - 12.0 %   Eosinophils Relative 1.7 0.0 - 5.0 %   Basophils Relative 0.4 0.0 - 3.0 %   Neutro Abs 2.7 1.4 - 7.7 K/uL   Lymphs Abs 2.0 0.7 - 4.0 K/uL   Monocytes Absolute 0.5 0.1 - 1.0 K/uL   Eosinophils Absolute 0.1 0.0 - 0.7 K/uL   Basophils Absolute 0.0 0.0 - 0.1 K/uL  POCT urinalysis dipstick  Result Value Ref Range   Color, UA yellow    Clarity, UA hazy    Glucose, UA negative    Bilirubin, UA negative    Ketones, UA negative    Spec Grav, UA 1.010    Blood, UA trace    pH, UA 6.0    Protein, UA negative    Urobilinogen, UA 0.2    Nitrite, UA negative    Leukocytes, UA small (1+)       Assessment & Plan:   Problem List Items Addressed This Visit    Malignant hypertension with chronic kidney disease stage IV - Primary    Elevated reading today, better on rpt but still too high - will not change regimen for now. Recheck 3 mo with labs. Advised ensure she is taking meds as prescribed.    B12 deficiency    b12 shot today. Will check B12 next office visit.    Relevant Medications      cyanocobalamin ((VITAMIN B-12)) injection 1,000 mcg (Start on 08/02/2014 11:45 AM)   Alzheimer's disease    Anticipated alz+vasc dementia. Continue aricept 5mg  and namena 14mg  XR. Worsening memory noted. Consider increase in aricept. To notify me if namenda too expensive.         Follow up plan: Return in about 3 months (around 11/01/2014), or as needed, for follow up visit.    ADDENDUM ==> reviewed ER records from ER visit 07/16/2014 Dx dehydration,  EKG bradycardia o/w stable CBC WNL, Cr 1.74, glu 94, K 4.4, AST 17, ALT 18, ALP 78, TnI <0.02, UA WNL Head CT - no acute process, old R basal gangila and L thalamic lacunar infarcts, small vessel ischemic changes.

## 2014-08-02 NOTE — Patient Instructions (Addendum)
B12 shot today. Sign release for records from [facility] ER visit.  Increase water - try with cystal light. Very important to stay well hydrated especially on your medicines. Return to see me in 3 months. Blood work at that time. Blood pressure is high today! Make sure you're taking all your medicines every day at home.

## 2014-08-02 NOTE — Assessment & Plan Note (Signed)
Anticipated alz+vasc dementia. Continue aricept 5mg  and namena 14mg  XR. Worsening memory noted. Consider increase in aricept. To notify me if namenda too expensive.

## 2014-08-02 NOTE — Progress Notes (Signed)
Pre visit review using our clinic review tool, if applicable. No additional management support is needed unless otherwise documented below in the visit note. 

## 2014-08-02 NOTE — Assessment & Plan Note (Signed)
Elevated reading today, better on rpt but still too high - will not change regimen for now. Recheck 3 mo with labs. Advised ensure she is taking meds as prescribed.

## 2014-08-02 NOTE — Assessment & Plan Note (Signed)
b12 shot today. Will check B12 next office visit.

## 2014-08-15 ENCOUNTER — Encounter: Payer: Self-pay | Admitting: Family Medicine

## 2014-11-02 ENCOUNTER — Encounter: Payer: Self-pay | Admitting: Family Medicine

## 2014-11-02 ENCOUNTER — Ambulatory Visit (INDEPENDENT_AMBULATORY_CARE_PROVIDER_SITE_OTHER): Payer: Commercial Managed Care - HMO | Admitting: Family Medicine

## 2014-11-02 VITALS — BP 132/68 | HR 72 | Temp 98.0°F | Wt 155.8 lb

## 2014-11-02 DIAGNOSIS — N184 Chronic kidney disease, stage 4 (severe): Secondary | ICD-10-CM

## 2014-11-02 DIAGNOSIS — E559 Vitamin D deficiency, unspecified: Secondary | ICD-10-CM | POA: Diagnosis not present

## 2014-11-02 DIAGNOSIS — F0390 Unspecified dementia without behavioral disturbance: Secondary | ICD-10-CM

## 2014-11-02 DIAGNOSIS — R2681 Unsteadiness on feet: Secondary | ICD-10-CM

## 2014-11-02 DIAGNOSIS — I129 Hypertensive chronic kidney disease with stage 1 through stage 4 chronic kidney disease, or unspecified chronic kidney disease: Secondary | ICD-10-CM | POA: Diagnosis not present

## 2014-11-02 DIAGNOSIS — F03B0 Unspecified dementia, moderate, without behavioral disturbance, psychotic disturbance, mood disturbance, and anxiety: Secondary | ICD-10-CM

## 2014-11-02 LAB — RENAL FUNCTION PANEL
Albumin: 4.1 g/dL (ref 3.5–5.2)
BUN: 49 mg/dL — ABNORMAL HIGH (ref 6–23)
CO2: 25 meq/L (ref 19–32)
Calcium: 9.4 mg/dL (ref 8.4–10.5)
Chloride: 106 mEq/L (ref 96–112)
Creatinine, Ser: 1.76 mg/dL — ABNORMAL HIGH (ref 0.40–1.20)
GFR: 28.69 mL/min — AB (ref >60.00)
Glucose, Bld: 96 mg/dL (ref 70–99)
PHOSPHORUS: 4.5 mg/dL (ref 2.3–4.6)
Potassium: 4.8 mEq/L (ref 3.5–5.1)
Sodium: 137 mEq/L (ref 135–145)

## 2014-11-02 LAB — VITAMIN D 25 HYDROXY (VIT D DEFICIENCY, FRACTURES): VITD: 18.79 ng/mL — AB (ref 30.00–100.00)

## 2014-11-02 LAB — LDL CHOLESTEROL, DIRECT: LDL DIRECT: 148 mg/dL

## 2014-11-02 MED ORDER — DONEPEZIL HCL 10 MG PO TABS: 10.0000 mg | ORAL_TABLET | Freq: Every day | ORAL | Status: AC

## 2014-11-02 NOTE — Progress Notes (Signed)
BP 132/68 mmHg  Pulse 72  Temp(Src) 98 F (36.7 C) (Oral)  Wt 155 lb 12 oz (70.648 kg)   CC: 3 mo f/u visit  Subjective:    Patient ID: Angela Fuentes, female    DOB: [birth year], 81 y.o.   MRN: [id number]  HPI: Angela Fuentes is a 81 y.o. female presenting on 11/02/2014 for Follow-up   Presents with niece Lattie Corns today for f/u.   H/o resistant HTN and CKD stage 3-4. No HA, vision changes, CP/tightness, SOB, leg swelling. Still using pill box. Last visit bp elevated but may not have been taking meds as prescribed.  H/o dementia - thought alz and vascular dementia combination. Has regularly been taking namenda XR and last visit we started aricept  daily, tolerating well. Worsening memory. No visual hallucinations. Occasional auditory hallucinations. Occasional confusion - confuses husband for father. + continued imbalance and falls - especially when first stands up. No significant incontinence.   Has never had HHPT.  Rare bowel and bladder accidents. Not using depens.  Limited cooking - some sandwiches.   Relevant past medical, surgical, family and social history reviewed and updated as indicated. Interim medical history since our last visit reviewed. Allergies and medications reviewed and updated. Current Outpatient Prescriptions on File Prior to Visit  Medication Sig  . amLODipine (NORVASC) 10 MG tablet Take 1 tablet (10 mg total) by mouth daily.  Marland Kitchen aspirin 81 MG tablet Take 81 mg by mouth daily.    Marland Kitchen atenolol (TENORMIN) 25 MG tablet Take 1 tablet (25 mg total) by mouth daily.  . cyanocobalamin (,VITAMIN B-12,) 1000 MCG/ML injection Inject 1 mL (1,000 mcg total) into the muscle every 30 (thirty) days. Start 04/2012  . lisinopril (PRINIVIL,ZESTRIL) 20 MG tablet Take 1 tablet (20 mg total) by mouth daily.  . rosuvastatin (CRESTOR) 20 MG tablet Take 1 tablet (20 mg total) by mouth at bedtime.  . triamterene-hydrochlorothiazide (MAXZIDE-25) 37.5-25 MG per tablet Take 1  tablet by mouth daily.   No current facility-administered medications on file prior to visit.    Review of Systems Per HPI unless specifically indicated above     Objective:    BP 132/68 mmHg  Pulse 72  Temp(Src) 98 F (36.7 C) (Oral)  Wt 155 lb 12 oz (70.648 kg)  Wt Readings from Last 3 Encounters:  11/02/14 155 lb 12 oz (70.648 kg)  08/02/14 153 lb 12 oz (69.741 kg)  04/02/14 152 lb 8 oz (69.174 kg)    Physical Exam  Constitutional: She appears well-developed and well-nourished. No distress.  HENT:  Mouth/Throat: Oropharynx is clear and moist. No oropharyngeal exudate.  Cardiovascular: Normal rate, regular rhythm, normal heart sounds and intact distal pulses.   No murmur heard. Pulmonary/Chest: Effort normal and breath sounds normal. No respiratory distress. She has no wheezes. She has no rales.  Musculoskeletal: She exhibits no edema.  Neurological:  Unsteady gait, holding on to walls  Skin: Skin is warm and dry. No rash noted.  Psychiatric: Her mood appears anxious.  Pleasantly demented, mildly anxious  Nursing note and vitals reviewed.  Results for orders placed or performed in visit on 08/02/14  CBC and differential  Result Value Ref Range   Hemoglobin 12.7 12.0 - 16.0 g/dL   Platelets 914 782 - 956 K/L   WBC 7.0 10^3/mL  Basic metabolic panel  Result Value Ref Range   Glucose 94 mg/dL   Creatinine 1.7 (A) .5 - 1.1 mg/dL   Potassium 4.4 3.4 - 5.3  mmol/L   Sodium 139 137 - 147 mmol/L  Hepatic function panel  Result Value Ref Range   Alkaline Phosphatase 78 25 - 125 U/L   ALT 18 7 - 35 U/L   AST 17 13 - 35 U/L   Bilirubin, Total 0.8 mg/dL      Assessment & Plan:   Problem List Items Addressed This Visit    Vitamin D deficiency   Relevant Orders   Vit D  25 hydroxy (rtn osteoporosis monitoring)   Moderate dementia without behavioral disturbance    Alz+vascular dementia. Continue discussion with pt and husband about moving in to ALF. Discussed  safety concern of pt and husband continuing to stay at home (husband with significant physical impairments as well). namenda very expensive - will stop this. Increase aricept to 10mg  daily. Avoid cooking. rec start wearing depens if increased accidents.      Relevant Medications   donepezil (ARICEPT) tablet   Other Relevant Orders   Ambulatory referral to Home Health   Malignant hypertension with chronic kidney disease stage IV - Primary    bp better today - anticipate from better compliance with regimen. No changes indicated.      Gait instability    rec start using cane. Referral to HHPT for fall risk reduction and home safety evaluation today Pt and niece agree with plan.      Relevant Orders   Ambulatory referral to Home Health   CKD (chronic kidney disease) stage 4, GFR 15-29 ml/min    Check labs today      Relevant Orders   Renal function panel   LDL Cholesterol, Direct       Follow up plan: Return in about 3 months (around 02/01/2015), or as needed, for follow up visit.

## 2014-11-02 NOTE — Assessment & Plan Note (Signed)
bp better today - anticipate from better compliance with regimen. No changes indicated.

## 2014-11-02 NOTE — Patient Instructions (Addendum)
Buy rubber soled socks for at home. I recommend removing throw rugs from house. Start using cane for support when walking - especially when first getting up.  We will have physical therapy come out to house to help. Let's increase aricept to 10mg  daily - watch for nausea with higher dose. Ok to stop memantine (namenda) for now. Blood work today. We will call [phone] with referral

## 2014-11-02 NOTE — Assessment & Plan Note (Addendum)
Alz+vascular dementia. Continue discussion with pt and husband about moving in to ALF. Discussed safety concern of pt and husband continuing to stay at home (husband with significant physical impairments as well). namenda very expensive - will stop this. Increase aricept to 10mg  daily. Avoid cooking. rec start wearing depens if increased accidents.

## 2014-11-02 NOTE — Assessment & Plan Note (Addendum)
rec start using cane. Referral to HHPT for fall risk reduction and home safety evaluation today Pt and niece agree with plan.

## 2014-11-02 NOTE — Progress Notes (Signed)
Pre visit review using our clinic review tool, if applicable. No additional management support is needed unless otherwise documented below in the visit note. 

## 2014-11-02 NOTE — Assessment & Plan Note (Signed)
Check labs today.

## 2014-11-04 ENCOUNTER — Other Ambulatory Visit: Payer: Self-pay | Admitting: Family Medicine

## 2014-11-04 MED ORDER — VITAMIN D3 25 MCG (1000 UT) PO CAPS
1.0000 | ORAL_CAPSULE | Freq: Every day | ORAL | Status: DC
Start: 2014-11-04 — End: 2016-04-03

## 2014-11-05 ENCOUNTER — Encounter: Payer: Self-pay | Admitting: *Deleted

## 2014-11-29 DIAGNOSIS — R296 Repeated falls: Secondary | ICD-10-CM | POA: Diagnosis not present

## 2014-11-29 DIAGNOSIS — I129 Hypertensive chronic kidney disease with stage 1 through stage 4 chronic kidney disease, or unspecified chronic kidney disease: Secondary | ICD-10-CM | POA: Diagnosis not present

## 2014-11-29 DIAGNOSIS — F0390 Unspecified dementia without behavioral disturbance: Secondary | ICD-10-CM | POA: Diagnosis not present

## 2014-11-29 DIAGNOSIS — R2689 Other abnormalities of gait and mobility: Secondary | ICD-10-CM | POA: Diagnosis not present

## 2014-12-06 DIAGNOSIS — R296 Repeated falls: Secondary | ICD-10-CM | POA: Diagnosis not present

## 2014-12-06 DIAGNOSIS — R2689 Other abnormalities of gait and mobility: Secondary | ICD-10-CM | POA: Diagnosis not present

## 2014-12-06 DIAGNOSIS — I129 Hypertensive chronic kidney disease with stage 1 through stage 4 chronic kidney disease, or unspecified chronic kidney disease: Secondary | ICD-10-CM | POA: Diagnosis not present

## 2014-12-06 DIAGNOSIS — F0390 Unspecified dementia without behavioral disturbance: Secondary | ICD-10-CM | POA: Diagnosis not present

## 2015-02-12 ENCOUNTER — Encounter: Payer: Self-pay | Admitting: Family Medicine

## 2015-02-12 ENCOUNTER — Ambulatory Visit (INDEPENDENT_AMBULATORY_CARE_PROVIDER_SITE_OTHER): Payer: Commercial Managed Care - HMO | Admitting: Family Medicine

## 2015-02-12 VITALS — BP 150/80 | HR 68 | Temp 97.6°F | Wt 156.5 lb

## 2015-02-12 DIAGNOSIS — F0390 Unspecified dementia without behavioral disturbance: Secondary | ICD-10-CM

## 2015-02-12 DIAGNOSIS — I129 Hypertensive chronic kidney disease with stage 1 through stage 4 chronic kidney disease, or unspecified chronic kidney disease: Secondary | ICD-10-CM

## 2015-02-12 DIAGNOSIS — F03B0 Unspecified dementia, moderate, without behavioral disturbance, psychotic disturbance, mood disturbance, and anxiety: Secondary | ICD-10-CM

## 2015-02-12 DIAGNOSIS — N184 Chronic kidney disease, stage 4 (severe): Secondary | ICD-10-CM

## 2015-02-12 NOTE — Progress Notes (Signed)
Pre visit review using our clinic review tool, if applicable. No additional management support is needed unless otherwise documented below in the visit note. 

## 2015-02-12 NOTE — Assessment & Plan Note (Signed)
bp adequate. Good control in past. Elevated bp attributed to agitation from coming into doctor's office - doesn't remember me or our office.

## 2015-02-12 NOTE — Assessment & Plan Note (Signed)
vasc + alz dementia.  Pt/husband continue to refuse ALF or higher level of care. They want to stay at home. Discussed with pt and niece that given dementia diagnosis she would be eligible for home hospice evaluation, niece will discuss this with pt's husband.  Continue aricept  daily. Pt does not drive.

## 2015-02-12 NOTE — Patient Instructions (Addendum)
You would be eligible for hospice help at home if you'd like. But this is fully up to you.  Return early December for medicare wellness visit, sooner if needed.

## 2015-02-12 NOTE — Assessment & Plan Note (Signed)
No labs today. Recheck 07/2015 at [facility].

## 2015-02-12 NOTE — Progress Notes (Signed)
BP 150/80 mmHg  Pulse 68  Temp(Src) 97.6 F (36.4 C) (Oral)  Wt 156 lb 8 oz (70.988 kg)   CC: 3 mo f/u visit  Subjective:    Patient ID: Angela Fuentes, female    DOB: [birth year], 81 y.o.   MRN: [id number]  HPI: Angela Fuentes is a 81 y.o. female presenting on 02/12/2015 for Follow-up   Presents with hiece Angela Fuentes today for follow up.  H/o resistant HTN with CKD stage 3-4. Reports compliance with meds.  H/o dementia - thought alz and vascular dementia combination. Namenda XR was too expensive, now only on aricept  daily. Worsening memory. Getting lost in bedroom - unable to find her way out.   No visual hallucinations. Occasional auditory hallucinations. Occasional confusion - confuses husband for father. + continued imbalance and falls - especially when first stands up. No significant incontinence.   Last visit we also referred patient to HHPT for fall risk reduction and home safety evaluation. Not using walker. Continues fall.   Pt and wife refuse to consider assisted living.   Relevant past medical, surgical, family and social history reviewed and updated as indicated. Interim medical history since our last visit reviewed. Allergies and medications reviewed and updated. Current Outpatient Prescriptions on File Prior to Visit  Medication Sig  . amLODipine (NORVASC) 10 MG tablet Take 1 tablet (10 mg total) by mouth daily.  Marland Kitchen aspirin 81 MG tablet Take 81 mg by mouth daily.    Marland Kitchen atenolol (TENORMIN) 25 MG tablet Take 1 tablet (25 mg total) by mouth daily.  . Cholecalciferol (VITAMIN D3) 1000 UNITS CAPS Take 1 capsule (1,000 Units total) by mouth daily.  . cyanocobalamin (,VITAMIN B-12,) 1000 MCG/ML injection Inject 1 mL (1,000 mcg total) into the muscle every 30 (thirty) days. Start 04/2012  . donepezil (ARICEPT) 10 MG tablet Take 1 tablet (10 mg total) by mouth at bedtime.  Marland Kitchen lisinopril (PRINIVIL,ZESTRIL) 20 MG tablet Take 1 tablet (20 mg total) by mouth daily.  .  rosuvastatin (CRESTOR) 20 MG tablet Take 1 tablet (20 mg total) by mouth at bedtime.  . triamterene-hydrochlorothiazide (MAXZIDE-25) 37.5-25 MG per tablet Take 1 tablet by mouth daily.   No current facility-administered medications on file prior to visit.    Review of Systems Per HPI unless specifically indicated above     Objective:    BP 150/80 mmHg  Pulse 68  Temp(Src) 97.6 F (36.4 C) (Oral)  Wt 156 lb 8 oz (70.988 kg)  Wt Readings from Last 3 Encounters:  02/12/15 156 lb 8 oz (70.988 kg)  11/02/14 155 lb 12 oz (70.648 kg)  08/02/14 153 lb 12 oz (69.741 kg)    Physical Exam  Constitutional: She appears well-developed and well-nourished. No distress.  Cardiovascular: Normal rate, regular rhythm, normal heart sounds and intact distal pulses.   No murmur heard. Pulmonary/Chest: Effort normal and breath sounds normal. No respiratory distress. She has no wheezes. She has no rales.  Musculoskeletal: She exhibits no edema.  Psychiatric: She has a normal mood and affect.  Pleasantly demented  Nursing note and vitals reviewed.  Results for orders placed or performed in visit on 11/02/14  Renal function panel  Result Value Ref Range   Sodium 137 135 - 145 mEq/L   Potassium 4.8 3.5 - 5.1 mEq/L   Chloride 106 96 - 112 mEq/L   CO2 25 19 - 32 mEq/L   Calcium 9.4 8.4 - 10.5 mg/dL   Albumin 4.1 3.5 - 5.2 g/dL  BUN 49 (H) 6 - 23 mg/dL   Creatinine, Ser 1.611.76 (H) 0.40 - 1.20 mg/dL   Glucose, Bld 96 70 - 99 mg/dL   Phosphorus 4.5 2.3 - 4.6 mg/dL   GFR 09.6028.69 (L) >45.40>60.00 mL/min  Vit D  25 hydroxy (rtn osteoporosis monitoring)  Result Value Ref Range   VITD 18.79 (L) 30.00 - 100.00 ng/mL  LDL Cholesterol, Direct  Result Value Ref Range   Direct LDL 148.0 mg/dL      Assessment & Plan:   Problem List Items Addressed This Visit    CKD (chronic kidney disease) stage 4, GFR 15-29 ml/min    No labs today. Recheck 07/2015 at [facility].       Malignant hypertension with chronic kidney  disease stage IV - Primary    bp adequate. Good control in past. Elevated bp attributed to agitation from coming into doctor's office - doesn't remember me or our office.      Moderate dementia without behavioral disturbance    vasc + alz dementia.  Pt/husband continue to refuse ALF or higher level of care. They want to stay at home. Discussed with pt and niece that given dementia diagnosis she would be eligible for home hospice evaluation, niece will discuss this with pt's husband.  Continue aricept 10mg  daily. Pt does not drive.          Follow up plan: Return in about 5 months (around 07/15/2015), or as needed, for medicare wellness.

## 2015-08-14 ENCOUNTER — Telehealth: Payer: Self-pay | Admitting: Family Medicine

## 2015-08-14 DIAGNOSIS — F0391 Unspecified dementia with behavioral disturbance: Secondary | ICD-10-CM

## 2015-08-14 DIAGNOSIS — F03918 Unspecified dementia, unspecified severity, with other behavioral disturbance: Secondary | ICD-10-CM

## 2015-08-14 NOTE — Telephone Encounter (Signed)
Emily,patient's niece,called.  Patient's [facility] is in [facility].  Patient had an appointment on Friday for a physical,but she had to cancel.  Patient's sister and Irving Burtonmily have been staying with her 24/7.  Patient's dementia has gotten worse.  Patient at times doesn't know her name and she thinks she' on a trip.  Patient can be violent at times. Irving Burtonmily said if Dr.Gutierrez has any questions, he can call her at anytime.

## 2015-08-15 MED ORDER — TRAZODONE HCL 50 MG PO TABS: 25.0000 mg | ORAL_TABLET | Freq: Every evening | ORAL | Status: AC | PRN

## 2015-08-15 NOTE — Telephone Encounter (Signed)
Husband in hospice, likely will not return home.  Pt's sister and niece staying with her 24/7 for last 3 weeks.  Dementia has significantly worsened Roaming at night, worse at night time.  Some violence tendencies.  Hides things. +paranoia. +auditory hallucinations. Not visual.  Some lucid moments but less frequently.  Sundowning. Does better during the day.   Family worried she will need to be placed. Nephew is working on [employer] to get her placed.   Appetite is good. She is regular with her medicines No trouble with incontinence No trouble with ambulation Dresses self without assistance  No fever, cough, dysuria, urgency or frequency.   Will refer to hospice for evaluation on hospice eligibility. Will start trazodone 25-50mg  nightly for sundowning.

## 2015-08-15 NOTE — Telephone Encounter (Signed)
Angela Fuentes called wanting Dr Reece AgarG to call her.  She needs advise on how to handle pt  right now.  Her dementia has gone down hill.  She gets upset easily  Best number [phone]

## 2015-08-16 ENCOUNTER — Encounter: Payer: Commercial Managed Care - HMO | Admitting: Family Medicine

## 2015-08-16 NOTE — Telephone Encounter (Signed)
Emily aware [facility] will call to set up time to come out to home.

## 2015-08-20 ENCOUNTER — Telehealth: Payer: Self-pay | Admitting: Family Medicine
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
## 2015-08-20 NOTE — Telephone Encounter (Signed)
Not eligible for hospice - too high functioning.  Let message on Ms Ingle's voice mail to touch base.

## 2015-08-20 NOTE — Telephone Encounter (Signed)
Spoke with niece [name].

## 2015-08-20 NOTE — Telephone Encounter (Signed)
[name] called and said patient's husband just passed away.  Emily's asking for Dr.Gutierrez to call her back today, when he can, to let her know how she should handle breaking the news to patient.

## 2015-08-20 NOTE — Telephone Encounter (Addendum)
Spoke w/ niece. She and pt's sister are staying with her 24/7.  Offered HH which niece agrees to. Referral placed. Pt currently lacks capacity to make medical decisions.   From last medicare wellness visit: Advanced directives - hasn't had done. Wants HCPOA as husband Leonette Most then brother Brantley Stage) then 2 sisters 2011/12/02) Brother Elmo passed away 10-12 yrs ago. Only has 1 living sister.

## 2015-08-20 NOTE — Addendum Note (Signed)
Addended by: Eustaquio Boyden on: 08/20/2015 02:16 PM   Modules accepted: Orders

## 2015-08-21 NOTE — Telephone Encounter (Signed)
See phone note from 08/14/2015

## 2015-08-21 NOTE — Telephone Encounter (Signed)
Message left  to return my call.

## 2015-08-21 NOTE — Telephone Encounter (Signed)
Angela Fuentes returned your call [phone]

## 2015-08-21 NOTE — Telephone Encounter (Signed)
Angela Fuentes called back Best number [phone] Angela Fuentes would like a call back

## 2015-08-21 NOTE — Telephone Encounter (Signed)
Forms faxed to [employer] and originals placed up front for pick up.

## 2015-08-21 NOTE — Telephone Encounter (Signed)
Angela Fuentes returned your call Best number [phone]
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
## 2015-08-21 NOTE — Telephone Encounter (Signed)
See other phone note

## 2015-08-21 NOTE — Telephone Encounter (Signed)
Message left for Angela Fuentes to return my call.  

## 2015-08-21 NOTE — Telephone Encounter (Signed)
Plz notify niece - spoke with HH who needs in office face to face visit to accept patient as it's been >6 months since seen in office. Can they bring her in for appointment?

## 2015-08-21 NOTE — Telephone Encounter (Signed)
FL2 in Kim's box.  Letter in Kim's box.

## 2015-08-21 NOTE — Telephone Encounter (Signed)
FL2 in your IN box for review and completion.

## 2015-08-21 NOTE — Telephone Encounter (Signed)
Spoke with [name]. She said they are in the middle of trying to plan the funeral arrangements for pt's husband and with the way pt is right now, she will have to have help in getting her here for the appt. She also said that DSS is trying to get her placed in a facility, so she may not need the appt here after all. She will call at the beginning of next week and schedule a 30 min appt if needed. I also have a call into DSS.

## 2015-08-27 NOTE — Telephone Encounter (Signed)
Message left for Tina to return my call.

## 2015-08-27 NOTE — Telephone Encounter (Signed)
Angela Fuentes from [facility] called and asked for Selena Batten to return her call at [phone]. She is needing additional medical records.

## 2015-08-28 NOTE — Telephone Encounter (Signed)
Filled and in Kim's box. 

## 2015-08-28 NOTE — Telephone Encounter (Signed)
FL2 faxed as directed.

## 2015-08-28 NOTE — Telephone Encounter (Signed)
Spoke with Nadine Counts at [employer]. He said that they have been awarded temporary guardianship of patient and have gotten her placed at [facility]. In order for her to stay in the locked unit, they need a new FL2 with dementia as the #1 diagnosis ASAP. In your IN box for completion. Fax attn to [employer] or Natalia Leatherwood at [phone]. Someone from social services will pick up the original.

## 2016-03-25 ENCOUNTER — Telehealth: Payer: Self-pay | Admitting: Family Medicine

## 2016-03-25 NOTE — Telephone Encounter (Signed)
Irving Burtonmily called back stating pt is in [facility] memory care since jan 2017

## 2016-03-25 NOTE — Telephone Encounter (Signed)
Pt home number has been disconnected.   

## 2016-03-25 NOTE — Telephone Encounter (Signed)
Left voicemail on niece phone (emerg contact) to have pt call us and schedule AWV before 05/02/16 per email on 8/23

## 2016-04-03 ENCOUNTER — Emergency Department: Payer: Medicare Other

## 2016-04-03 ENCOUNTER — Emergency Department
Admission: EM | Admit: 2016-04-03 | Discharge: 2016-04-03 | Disposition: A | Discharge status: Home / Self Care | Payer: Medicare Other | Attending: Emergency Medicine | Admitting: Emergency Medicine

## 2016-04-03 ENCOUNTER — Encounter: Payer: Self-pay | Admitting: Emergency Medicine

## 2016-04-03 DIAGNOSIS — Z79899 Other long term (current) drug therapy: Secondary | ICD-10-CM | POA: Diagnosis not present

## 2016-04-03 DIAGNOSIS — F0390 Unspecified dementia without behavioral disturbance: Secondary | ICD-10-CM | POA: Insufficient documentation

## 2016-04-03 DIAGNOSIS — N184 Chronic kidney disease, stage 4 (severe): Secondary | ICD-10-CM | POA: Diagnosis not present

## 2016-04-03 DIAGNOSIS — R4182 Altered mental status, unspecified: Secondary | ICD-10-CM | POA: Diagnosis present

## 2016-04-03 DIAGNOSIS — N189 Chronic kidney disease, unspecified: Secondary | ICD-10-CM

## 2016-04-03 DIAGNOSIS — Z8673 Personal history of transient ischemic attack (TIA), and cerebral infarction without residual deficits: Secondary | ICD-10-CM | POA: Insufficient documentation

## 2016-04-03 DIAGNOSIS — I129 Hypertensive chronic kidney disease with stage 1 through stage 4 chronic kidney disease, or unspecified chronic kidney disease: Secondary | ICD-10-CM | POA: Insufficient documentation

## 2016-04-03 DIAGNOSIS — Z7982 Long term (current) use of aspirin: Secondary | ICD-10-CM | POA: Diagnosis not present

## 2016-04-03 LAB — CBC WITH DIFFERENTIAL/PLATELET
BASOS ABS: 0 10*3/uL (ref 0–0.1)
BASOS PCT: 1 %
Eosinophils Absolute: 0 10*3/uL (ref 0–0.7)
Eosinophils Relative: 1 %
HEMATOCRIT: 33 % — AB (ref 35.0–47.0)
HEMOGLOBIN: 11.2 g/dL — AB (ref 12.0–16.0)
Lymphocytes Relative: 17 %
Lymphs Abs: 1 10*3/uL (ref 1.0–3.6)
MCH: 31.5 pg (ref 26.0–34.0)
MCHC: 33.8 g/dL (ref 32.0–36.0)
MCV: 93.1 fL (ref 80.0–100.0)
Monocytes Absolute: 0.7 10*3/uL (ref 0.2–0.9)
Monocytes Relative: 11 %
NEUTROS ABS: 4.5 10*3/uL (ref 1.4–6.5)
NEUTROS PCT: 72 %
Platelets: 126 10*3/uL — ABNORMAL LOW (ref 150–440)
RBC: 3.54 MIL/uL — AB (ref 3.80–5.20)
RDW: 15.3 % — ABNORMAL HIGH (ref 11.5–14.5)
WBC: 6.3 10*3/uL (ref 3.6–11.0)

## 2016-04-03 LAB — HEPATIC FUNCTION PANEL
ALT: 9 U/L — AB (ref 14–54)
AST: 18 U/L (ref 15–41)
Albumin: 3.5 g/dL (ref 3.5–5.0)
Alkaline Phosphatase: 48 U/L (ref 38–126)
BILIRUBIN DIRECT: 0.1 mg/dL (ref 0.1–0.5)
BILIRUBIN INDIRECT: 0.6 mg/dL (ref 0.3–0.9)
BILIRUBIN TOTAL: 0.7 mg/dL (ref 0.3–1.2)
Total Protein: 6 g/dL — ABNORMAL LOW (ref 6.5–8.1)

## 2016-04-03 LAB — URINALYSIS COMPLETE WITH MICROSCOPIC (ARMC ONLY)
BACTERIA UA: NONE SEEN
BILIRUBIN URINE: NEGATIVE
Glucose, UA: NEGATIVE mg/dL
HGB URINE DIPSTICK: NEGATIVE
Ketones, ur: NEGATIVE mg/dL
LEUKOCYTES UA: NEGATIVE
NITRITE: NEGATIVE
PH: 6 (ref 5.0–8.0)
Protein, ur: NEGATIVE mg/dL
RBC / HPF: NONE SEEN RBC/hpf (ref 0–5)
SPECIFIC GRAVITY, URINE: 1.008 (ref 1.005–1.030)
WBC UA: NONE SEEN WBC/hpf (ref 0–5)

## 2016-04-03 LAB — BASIC METABOLIC PANEL
ANION GAP: 9 (ref 5–15)
BUN: 46 mg/dL — ABNORMAL HIGH (ref 6–20)
CALCIUM: 8.7 mg/dL — AB (ref 8.9–10.3)
CHLORIDE: 102 mmol/L (ref 101–111)
CO2: 27 mmol/L (ref 22–32)
Creatinine, Ser: 1.7 mg/dL — ABNORMAL HIGH (ref 0.44–1.00)
GFR calc non Af Amer: 25 mL/min — ABNORMAL LOW (ref >60)
GFR, EST AFRICAN AMERICAN: 29 mL/min — AB (ref >60)
Glucose, Bld: 127 mg/dL — ABNORMAL HIGH (ref 65–99)
Potassium: 4.3 mmol/L (ref 3.5–5.1)
Sodium: 138 mmol/L (ref 135–145)

## 2016-04-03 LAB — TROPONIN I: Troponin I: <0.03 ng/mL (ref <0.03)

## 2016-04-03 LAB — LACTIC ACID, PLASMA: LACTIC ACID, VENOUS: 1.5 mmol/L (ref 0.5–1.9)

## 2016-04-03 NOTE — ED Notes (Signed)
Pt has old yellow bruising to forehead above left eye brow. Pt states she fell off of the step and bumped her head.

## 2016-04-03 NOTE — ED Notes (Signed)
Pt awaiting EMS [facility] ro [facility]

## 2016-04-03 NOTE — ED Triage Notes (Signed)
[facility] in [place].  EMS reports patient had eaten breakfast but was found sitting in chair slumped over and cold, and that staff reported the patient was not breathing and was unconscious.  EMS states patient coughed up something when she awoke and has been following commands since. Pt has confusion at baseline with a hx of Dementia and syncope. Pt is alert and talking at this time. EMS states they had BP's initially in the 70s/40s and a temp of 88.  Pt is cool to the touch and denies any c/o's at this time.

## 2016-04-03 NOTE — ED Provider Notes (Signed)
[facility] Emergency Department Provider Note  ____________________________________________   First MD Initiated Contact with Patient 04/03/16 1058     (approximate)  I have reviewed the triage vital signs and the nursing notes.   HISTORY  Chief Complaint Altered Mental Status  Patient has a well documented history of vascular or Alzheimer's dementia and lives in a memory care unit  HPI Angela Fuentes is a 82 y.o. female with a well-documented history of dementia who lives in a memory care unit at [facility] house who presents for evaluation of unresponsiveness and altered mental status.  She reportedly was found slumped overin a chair after eating breakfast, reportedly not breathing and unresponsive.  The paramedics state that she coughed up something when she woke up and has been following commands since that time.  Reportedly per EMS her initial blood pressure was low and she had an oral temperature of 88, but she had a temperature of 97.9 upon arrival in the emergency department with normal vital signs.  She has been alert to self and location and complains of no symptoms at this point.  Specifically she denies fever/chills, chest pain, shortness of breath, nausea, vomiting, abdominal pain.  She states that she has not been sick recently and she remembers eating her breakfast and reading a book and then the next thing she remembered was going to the hospital.   Past Medical History:  Diagnosis Date  . B12 deficiency   . Calculus of gallbladder without mention of cholecystitis or obstruction   . Cardiomegaly   . Carotid stenosis, bilateral    [place] 08/2013, rpt 6 mo  . Chest pain, unspecified   . CKD (chronic kidney disease) stage 4, GFR 15-29 ml/min (HCC)    s/p eval by renal 09/2010 (normal SPEP/UPEP)  . DDD (degenerative disc disease) 12/2000   Cervical series /MRI neck multi level degen changes 05/02: cervical x-ray DDD 06/2003  . Dementia   . History  of CVA (cerebrovascular accident)    old R basal ganglia lacunar infarct by CT  . HLD (hyperlipidemia) 11/01/1997  . HTN (hypertension) 11/01/1997  . Hyperglycemia   . Labyrinthitis, unspecified   . Multiple thyroid nodules 01/2013   bilateral on carotid US  . Personal history of malignant neoplasm of ovary 1960s   s/p hysterectomy  . Syncope and collapse   . Vitamin D deficiency     Patient Active Problem List   Diagnosis Date Noted  . Gait instability 11/02/2014  . Multiple thyroid nodules 02/21/2013  . Secondary hyperparathyroidism (of renal origin) 02/25/2012  . B12 deficiency   . Medicare annual wellness visit, initial 01/29/2012  . Cerumen impaction 01/29/2012  . Dementia with behavioral disturbance 01/29/2012  . Hearing loss 01/29/2012  . Vitamin D deficiency   . CKD (chronic kidney disease) stage 4, GFR 15-29 ml/min (HCC) 08/20/2010  . Syncope and collapse 05/15/2009  . CAROTID ARTERY STENOSIS, BILATERAL 10/09/2008  . CHEST PAIN 10/09/2008  . VERTIGO SECONDARY TO LABRYNTHITIS 04/18/2007  . HX, PERSONAL, MALIGNANCY, OVARY 04/18/2007  . HYPERGLYCEMIA 09/03/2004  . HYPERLIPIDEMIA 11/01/1997  . 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 CHOLELITHIASIS 08/03/1949    Past Surgical History:  Procedure Laterality Date  . adenosine myoview  12/21/2007   Normal  . BACK SURGERY     L5/S1 ( Dr. Ocie Bob)  . carotid US  08/2012   stable: 60-79% RICA (higher end), 40-59% LICA, rec rpt 6 mo  . CHOLECYSTECTOMY  08/03/1949  . US ECHOCARDIOGRAPHY  02/19/2005   Echo EF 65% tr AR grossly normal  . VAGINAL HYSTERECTOMY  1960   ovarian cancer     Prior to Admission medications   Medication Sig Start Date End Date Taking? Authorizing Provider  acetaminophen (TYLENOL) 325 MG tablet Take 325 mg by mouth every 4 (four) hours as needed for mild pain, fever or headache.   Yes Historical Provider, MD  ALPRAZolam (XANAX) 0.25 MG tablet Take 0.25  mg by mouth every 8 (eight) hours as needed for anxiety.   Yes Historical Provider, MD  aspirin 81 MG chewable tablet Chew 81 mg by mouth daily.   Yes Historical Provider, MD  atenolol (TENORMIN) 25 MG tablet Take 25 mg by mouth daily.   Yes Historical Provider, MD  cholecalciferol (VITAMIN D) 1000 units tablet Take 1,000 Units by mouth daily.   Yes Historical Provider, MD  divalproex (DEPAKOTE SPRINKLE) 125 MG capsule Take 250 mg by mouth 2 (two) times daily.   Yes Historical Provider, MD  donepezil (ARICEPT) 10 MG tablet Take 1 tablet (10 mg total) by mouth at bedtime. 11/02/14  Yes Eustaquio Boyden, MD  lisinopril (PRINIVIL,ZESTRIL) 20 MG tablet Take 20 mg by mouth daily.   Yes Historical Provider, MD  memantine (NAMENDA) 10 MG tablet Take 10 mg by mouth 2 (two) times daily.   Yes Historical Provider, MD  mirtazapine (REMERON) 7.5 MG tablet Take 7.5 mg by mouth at bedtime.   Yes Historical Provider, MD  rosuvastatin (CRESTOR) 20 MG tablet Take 1 tablet (20 mg total) by mouth at bedtime. 07/11/14  Yes Eustaquio Boyden, MD  traZODone (DESYREL) 50 MG tablet Take 0.5-1 tablets (25-50 mg total) by mouth at bedtime as needed for sleep. 08/15/15  Yes Eustaquio Boyden, MD  triamterene-hydrochlorothiazide (MAXZIDE-25) 37.5-25 MG per tablet Take 1 tablet by mouth daily. 07/11/14  Yes Eustaquio Boyden, MD  vitamin B-12 (CYANOCOBALAMIN) 1000 MCG tablet Take 1,000 mcg by mouth daily.   Yes Historical Provider, MD    Allergies Penicillins  Family History  Problem Relation Age of Onset  . Hypertension Mother   . Stroke Mother 47    cva (hemm)  . Cancer Father     Prostate cancer  . COPD Brother     emphysema    Social History Social History  Substance Use Topics  . Smoking status: Never Smoker  . Smokeless tobacco: Never Used  . Alcohol use No    Review of Systems Constitutional: No fever/chills Eyes: No visual changes. ENT: No sore throat. Cardiovascular: Denies chest pain. Respiratory:  Denies shortness of breath. Gastrointestinal: No abdominal pain.  No nausea, no vomiting.  No diarrhea.  No constipation. Genitourinary: Negative for dysuria. Musculoskeletal: Negative for back pain. Skin: Negative for rash. Neurological: Negative for headaches, focal weakness or numbness.  10-point ROS otherwise negative.  ____________________________________________   PHYSICAL EXAM:  VITAL SIGNS: ED Triage Vitals  Enc Vitals Group     BP 04/03/16 1035 (!) 104/49     Pulse Rate 04/03/16 1035 60     Resp 04/03/16 1035 18     Temp 04/03/16 1035 97.9 F (36.6 C)     Temp Source 04/03/16 1035 Oral     SpO2 04/03/16 1035 97 %     Weight 04/03/16 1031 145 lb (65.8 kg)     Height 04/03/16 1031 5\' 3"  (1.6 m)     Head Circumference --      Peak Flow --  Pain Score 04/03/16 1032 0     Pain Loc --      Pain Edu? --      Excl. in GC? --     Constitutional: Alert and oriented To person and location. Well appearing and in no acute distress. Eyes: Conjunctivae are normal. PERRL. EOMI. Head: Old yellowing bruise to her forehead above her left eyebrow but no acute injuries visible.  Otherwise atraumatic Nose: No congestion/rhinnorhea. Mouth/Throat: Mucous membranes are moist.  Oropharynx non-erythematous. Neck: No stridor.  No meningeal signs.   Cardiovascular: Normal rate, regular rhythm. Good peripheral circulation. Grossly normal heart sounds. Respiratory: Normal respiratory effort.  No retractions. Lungs CTAB. Gastrointestinal: Soft and nontender. No distention.  Musculoskeletal: No lower extremity tenderness nor edema. No gross deformities of extremities. Neurologic:  Normal speech and language. No gross focal neurologic deficits are appreciated.  Skin:  Skin is warm, dry and intact. No rash noted.   Psychiatric: Mood and affect are normal. Speech and behavior are normal.  ____________________________________________   LABS (all labs ordered are listed, but only abnormal  results are displayed)  Labs Reviewed  CBC WITH DIFFERENTIAL/PLATELET - Abnormal; Notable for the following:       Result Value   RBC 3.54 (*)    Hemoglobin 11.2 (*)    HCT 33.0 (*)    RDW 15.3 (*)    Platelets 126 (*)    All other components within normal limits  BASIC METABOLIC PANEL - Abnormal; Notable for the following:    Glucose, Bld 127 (*)    BUN 46 (*)    Creatinine, Ser 1.70 (*)    Calcium 8.7 (*)    GFR calc non Af Amer 25 (*)    GFR calc Af Amer 29 (*)    All other components within normal limits  URINALYSIS COMPLETEWITH MICROSCOPIC ([facility] ONLY) - Abnormal; Notable for the following:    Color, Urine STRAW (*)    APPearance CLEAR (*)    Squamous Epithelial / LPF 0-5 (*)    All other components within normal limits  HEPATIC FUNCTION PANEL - Abnormal; Notable for the following:    Total Protein 6.0 (*)    ALT 9 (*)    All other components within normal limits  URINE CULTURE  TROPONIN I  LACTIC ACID, PLASMA  LACTIC ACID, PLASMA   ____________________________________________  EKG  ED ECG REPORT I, Semaya Vida, the attending physician, personally viewed and interpreted this ECG.  Date: 04/03/2016 EKG Time: 10:34 Rate: 60 Rhythm: normal sinus rhythm QRS Axis: normal Intervals: normal ST/T Wave abnormalities: normal Conduction Disturbances: none Narrative Interpretation: unremarkable  ____________________________________________  RADIOLOGY   Ct Head Wo Contrast  Result Date: 04/03/2016 CLINICAL DATA:  Dementia and syncope. Patient found slumped over and unresponsive earlier today. EXAM: CT HEAD WITHOUT CONTRAST TECHNIQUE: Contiguous axial images were obtained from the base of the skull through the vertex without intravenous contrast. COMPARISON:  CT head 07/16/2014. FINDINGS: Brain: No evidence for acute infarction, hemorrhage, mass lesion, hydrocephalus, or extra-axial fluid. Advanced atrophy with chronic microvascular ischemic change. Chronic lacunar  infarcts of the deep nuclei, stable. Vascular: There is a dense calcification in the LEFT M1 MCA distally which was present in 2015. No convincing features of large vessel occlusion. Advanced vascular calcification in the carotid siphons is redemonstrated. Skull: Unremarkable. Sinuses/Orbits: Unremarkable. Other: None. IMPRESSION: Advanced atrophy and small vessel disease. No acute intracranial findings. Electronically Signed   By: Elsie Stain M.D.   On: 04/03/2016 11:26  Dg Chest Portable 1 View  Result Date: 04/03/2016 CLINICAL DATA:  Earlier episode of non responsiveness with questionable aspiration EXAM: PORTABLE CHEST 1 VIEW COMPARISON:  August 04, 2012 FINDINGS: There is no edema or consolidation. Heart is mildly enlarged with pulmonary vascularity within normal limits. There is atherosclerotic calcification in the aorta. There is also calcification in the right carotid artery. No adenopathy. No bone lesions. IMPRESSION: Stable cardiomegaly. There is aortic atherosclerosis as well as calcification in the right carotid artery. There is no edema or consolidation. Electronically Signed   By: Bretta BangWilliam  Woodruff III M.D.   On: 04/03/2016 12:05    ____________________________________________   PROCEDURES  Procedure(s) performed:   Procedures   Critical Care performed: No ____________________________________________   INITIAL IMPRESSION / ASSESSMENT AND PLAN / ED COURSE  Pertinent labs & imaging results that were available during my care of the patient were reviewed by me and considered in my medical decision making (see chart for details).  The patient is well-appearing at this time.  The EMS presentation was certainly concerning, but her vital signs are normal including her temperature upon arrival in the ED and she seems to be at her baseline mental status, even being aware that she is in the [facility] department.  I will evaluate broadly with lab work, urinalysis, head CT,  chest x-ray, and we will reassess and discuss her workup with her family when they arrive.   Clinical Course  Comment By Time  The patient's workup has been unremarkable.  I spoke with her sister and her niece who commented that she actually seems better in terms of mental status than she normally has.  We had an extensive conversation about the results and the fact that I cannot find anything abnormal at this time.  The niece believes that the patient has a DO NOT INTUBATE/DO NOT RESUSCITATE order in place, but her brother is the power of attorney and keeps track of that cut her paperwork.  She thinks that the facility is aware of this, but the patient came with the paperwork.  I encouraged her to follow up about this and make sure that any time the patient goes anywhere, they send that paperwork.  I also encouraged them to pursue DO NOT RESUSCITATE/DO NOT INTUBATE because she stated emphatically that that is what the patient would have wanted prior to her dementia.  Looking through the paperwork that she was sent with, it does indicate her CODE STATUS is CPR, so I encouraged the family to follow up about this and to consider, as per the patient's wishes, changing her status to DO NOT RESUSCITATE/DO NOT RESUSCITATE.  However at this point I find no acute or emergent medical issue and will discharge her back to her living facility.  Her niece understands and agrees with this plan. Loleta Roseory Valerya Maxton, MD 09/01 1255    ____________________________________________  FINAL CLINICAL IMPRESSION(S) / ED DIAGNOSES  Final diagnoses:  Chronic kidney disease, unspecified stage  Chronic dementia, without behavioral disturbance     MEDICATIONS GIVEN DURING THIS VISIT:  Medications - No data to display   NEW OUTPATIENT MEDICATIONS STARTED DURING THIS VISIT:  New Prescriptions   No medications on file      Note:  This document was prepared using Dragon voice recognition software and may include  unintentional dictation errors.    Loleta Roseory Jamee Keach, MD 04/03/16 1352

## 2016-04-03 NOTE — Discharge Instructions (Signed)
As we discussed, your workup today was reassuring.  Though we do not know exactly what is causing your symptoms, it appears that you have no emergent medical condition at this time are safe to go home and follow up as recommended in this paperwork.  Blood work, urinalysis, chest x-ray, ECG, and head CT were all reassuring.  Please return immediately to the Emergency Department if you develop any new or worsening symptoms that concern you.

## 2016-04-04 LAB — URINE CULTURE: Culture: NO GROWTH

## 2016-06-19 ENCOUNTER — Emergency Department (HOSPITAL_COMMUNITY): Payer: Medicare Other

## 2016-06-19 ENCOUNTER — Emergency Department (HOSPITAL_COMMUNITY)
Admission: EM | Admit: 2016-06-19 | Discharge: 2016-06-20 | Disposition: A | Discharge status: Home / Self Care | Payer: Medicare Other | Attending: Emergency Medicine | Admitting: Emergency Medicine

## 2016-06-19 DIAGNOSIS — Z23 Encounter for immunization: Secondary | ICD-10-CM | POA: Diagnosis not present

## 2016-06-19 DIAGNOSIS — Z79899 Other long term (current) drug therapy: Secondary | ICD-10-CM | POA: Insufficient documentation

## 2016-06-19 DIAGNOSIS — S7011XA Contusion of right thigh, initial encounter: Secondary | ICD-10-CM | POA: Insufficient documentation

## 2016-06-19 DIAGNOSIS — Z7982 Long term (current) use of aspirin: Secondary | ICD-10-CM | POA: Insufficient documentation

## 2016-06-19 DIAGNOSIS — S41112A Laceration without foreign body of left upper arm, initial encounter: Secondary | ICD-10-CM | POA: Diagnosis not present

## 2016-06-19 DIAGNOSIS — Y999 Unspecified external cause status: Secondary | ICD-10-CM | POA: Diagnosis not present

## 2016-06-19 DIAGNOSIS — W06XXXA Fall from bed, initial encounter: Secondary | ICD-10-CM | POA: Diagnosis not present

## 2016-06-19 DIAGNOSIS — Y9289 Other specified places as the place of occurrence of the external cause: Secondary | ICD-10-CM | POA: Diagnosis not present

## 2016-06-19 DIAGNOSIS — I129 Hypertensive chronic kidney disease with stage 1 through stage 4 chronic kidney disease, or unspecified chronic kidney disease: Secondary | ICD-10-CM | POA: Diagnosis not present

## 2016-06-19 DIAGNOSIS — G9389 Other specified disorders of brain: Secondary | ICD-10-CM | POA: Insufficient documentation

## 2016-06-19 DIAGNOSIS — S7001XA Contusion of right hip, initial encounter: Secondary | ICD-10-CM

## 2016-06-19 DIAGNOSIS — Y9389 Activity, other specified: Secondary | ICD-10-CM | POA: Diagnosis not present

## 2016-06-19 DIAGNOSIS — W19XXXA Unspecified fall, initial encounter: Secondary | ICD-10-CM

## 2016-06-19 DIAGNOSIS — N184 Chronic kidney disease, stage 4 (severe): Secondary | ICD-10-CM | POA: Diagnosis not present

## 2016-06-19 MED ORDER — TETANUS-DIPHTH-ACELL PERTUSSIS 5-2.5-18.5 LF-MCG/0.5 IM SUSP
0.5000 mL | Freq: Once | INTRAMUSCULAR | Status: AC
  Administered 2016-06-19: 0.5 mL via INTRAMUSCULAR
  Filled 2016-06-19: qty 0.5

## 2016-06-19 NOTE — ED Provider Notes (Signed)
[facility]-EMERG[facility]Y DEPT Provider Note   CSN: [id number] Arrival date & time: 06/19/16  2323   By signing my name below, I, Morene Crocker, attest that this documentation has been prepared under the direction and in the presence of Glynn Octave, MD. Electronically Signed: Morene Crocker, Scribe. 06/20/16. 12:26 AM.   History   Chief Complaint Chief Complaint  Patient presents with  . Fall   The history is provided by the patient and the nursing home. History limited by: Dementia. No language interpreter was used.    LEVEL 5 CAVEAT DUE TO DEMENTIA   HPI Comments: Angela Fuentes is a 82 y.o. female brought in by EMS who presents to the Emergency Department for evaluation of injuries s/p fall that occurred PTA. Pt is from [facility] and was noted to fall out of her bed by staff. Staff reports pts right leg was swollen; however, pt is able to ambulate on the leg without difficulty. Patient states she slipped and fell when trying to get out of bed, but the fall was unwitnessed. LOC and head injury unknown. Pt currently complains of moderate right lateral thigh pain and ecchymosis. Facility notes the bruising and pain are from another fall; however, do not know when this fall occurred. Pt also presents with a skin tear to the left upper arm. She is ambulatory and uses walker on regular basis. Pt denies dizziness, HA, abdominal pain, chest pain, neck pain, SOB, bowel/bladder incontinence, dysuria, hematuria, worsened back pain from baseline, any other pain or injuries.     Past Medical History:  Diagnosis Date  . B12 deficiency   . Calculus of gallbladder without mention of cholecystitis or obstruction   . Cardiomegaly   . Carotid stenosis, bilateral    [place] 08/2013, rpt 6 mo  . Chest pain, unspecified   . CKD (chronic kidney disease) stage 4, GFR 15-29 ml/min (HCC)    s/p eval by renal 09/2010 (normal SPEP/UPEP)  . DDD (degenerative disc disease) 12/2000   Cervical series /MRI neck multi level  degen changes 05/02: cervical x-ray DDD 06/2003  . Dementia   . History of CVA (cerebrovascular accident)    old R basal ganglia lacunar infarct by CT  . HLD (hyperlipidemia) 11/01/1997  . HTN (hypertension) 11/01/1997  . Hyperglycemia   . Labyrinthitis, unspecified   . Multiple thyroid nodules 01/2013   bilateral on carotid US  . Personal history of malignant neoplasm of ovary 1960s   s/p hysterectomy  . Syncope and collapse   . Vitamin D deficiency     Patient Active Problem List   Diagnosis Date Noted  . Gait instability 11/02/2014  . Multiple thyroid nodules 02/21/2013  . Secondary hyperparathyroidism (of renal origin) 02/25/2012  . B12 deficiency   . Medicare annual wellness visit, initial 01/29/2012  . Cerumen impaction 01/29/2012  . Dementia with behavioral disturbance 01/29/2012  . Hearing loss 01/29/2012  . Vitamin D deficiency   . CKD (chronic kidney disease) stage 4, GFR 15-29 ml/min (HCC) 08/20/2010  . Syncope and collapse 05/15/2009  . CAROTID ARTERY STENOSIS, BILATERAL 10/09/2008  . CHEST PAIN 10/09/2008  . VERTIGO SECONDARY TO LABRYNTHITIS 04/18/2007  . HX, PERSONAL, MALIGNANCY, OVARY 04/18/2007  . HYPERGLYCEMIA 09/03/2004  . HYPERLIPIDEMIA 11/01/1997  . 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 CHOLELITHIASIS 08/03/1949    Past Surgical History:  Procedure Laterality Date  . adenosine myoview  12/21/2007   Normal  . BACK SURGERY     L5/S1 (  Dr. Ocie Bob)  . carotid US  08/2012   stable: 60-79% RICA (higher end), 40-59% LICA, rec rpt 6 mo  . CHOLECYSTECTOMY  08/03/1949  . US ECHOCARDIOGRAPHY  02/19/2005   Echo EF 65% tr AR grossly normal  . VAGINAL HYSTERECTOMY  1960   ovarian cancer     OB History    No data available       Home Medications    Prior to Admission medications   Medication Sig Start Date End Date Taking? Authorizing Provider  acetaminophen (TYLENOL) 325 MG tablet Take 325 mg by mouth  every 4 (four) hours as needed for mild pain, fever or headache.    Historical Provider, MD  ALPRAZolam Prudy Feeler) 0.25 MG tablet Take 0.25 mg by mouth every 8 (eight) hours as needed for anxiety.    Historical Provider, MD  aspirin 81 MG chewable tablet Chew 81 mg by mouth daily.    Historical Provider, MD  atenolol (TENORMIN) 25 MG tablet Take 25 mg by mouth daily.    Historical Provider, MD  cholecalciferol (VITAMIN D) 1000 units tablet Take 1,000 Units by mouth daily.    Historical Provider, MD  divalproex (DEPAKOTE SPRINKLE) 125 MG capsule Take 250 mg by mouth 2 (two) times daily.    Historical Provider, MD  donepezil (ARICEPT) 10 MG tablet Take 1 tablet (10 mg total) by mouth at bedtime. 11/02/14   Eustaquio Boyden, MD  lisinopril (PRINIVIL,ZESTRIL) 20 MG tablet Take 20 mg by mouth daily.    Historical Provider, MD  memantine (NAMENDA) 10 MG tablet Take 10 mg by mouth 2 (two) times daily.    Historical Provider, MD  mirtazapine (REMERON) 7.5 MG tablet Take 7.5 mg by mouth at bedtime.    Historical Provider, MD  rosuvastatin (CRESTOR) 20 MG tablet Take 1 tablet (20 mg total) by mouth at bedtime. 07/11/14   Eustaquio Boyden, MD  traZODone (DESYREL) 50 MG tablet Take 0.5-1 tablets (25-50 mg total) by mouth at bedtime as needed for sleep. 08/15/15   Eustaquio Boyden, MD  triamterene-hydrochlorothiazide (MAXZIDE-25) 37.5-25 MG per tablet Take 1 tablet by mouth daily. 07/11/14   Eustaquio Boyden, MD  vitamin B-12 (CYANOCOBALAMIN) 1000 MCG tablet Take 1,000 mcg by mouth daily.    Historical Provider, MD    Family History Family History  Problem Relation Age of Onset  . Hypertension Mother   . Stroke Mother 34    cva (hemm)  . Cancer Father     Prostate cancer  . COPD Brother     emphysema    Social History Social History  Substance Use Topics  . Smoking status: Never Smoker  . Smokeless tobacco: Never Used  . Alcohol use No     Allergies   Penicillins   Review of Systems Review of  Systems  Unable to perform ROS: Dementia     Physical Exam Updated Vital Signs BP 183/76 (BP Location: Left Arm)   Pulse 70   Temp 97.9 F (36.6 C) (Oral)   Resp 18   Wt 150 lb (68 kg)   SpO2 97%   BMI 26.57 kg/m   Physical Exam  Constitutional: She appears well-developed and well-nourished. No distress.  HENT:  Head: Normocephalic and atraumatic.  Right Ear: External ear normal.  Left Ear: External ear normal.  Mouth/Throat: Oropharynx is clear and moist.  Eyes: Conjunctivae and EOM are normal. Pupils are equal, round, and reactive to light.  Neck: Normal range of motion and phonation normal. Neck supple.  Cardiovascular: Normal  rate and regular rhythm.   Intact DP pulses  Pulmonary/Chest: Effort normal and breath sounds normal. She exhibits no tenderness.  Abdominal: Soft. She exhibits no distension. There is no tenderness. There is no guarding.  Musculoskeletal: Normal range of motion. She exhibits edema and tenderness. She exhibits no deformity.  No C/T/L spinal pain. FROM of right hip and knee.  Able to ambulate.   Neurological: She is alert. She exhibits normal muscle tone.  Oriented to person only 5/5 strength throughout. CN 2-12 intact.  Skin: Skin is warm and dry. Capillary refill takes less than 2 seconds. She is not diaphoretic.  Ecchymosis and hematoma to right proximal lateral and posterior femur.  2cm skin tear left posterior upper arm   Nursing note and vitals reviewed.    ED Treatments / Results  DIAGNOSTIC STUDIES: Oxygen Saturation is 97% on RA, normal by my interpretation.    COORDINATION OF CARE: 11:38 PM Discussed treatment plan with pt at bedside and pt agreed to plan.   Labs (all labs ordered are listed, but only abnormal results are displayed) Labs Reviewed - No data to display  EKG  EKG Interpretation None       Radiology Dg Pelvis 1-2 Views  Result Date: 06/20/2016 CLINICAL DATA:  Status post fall, with right leg swelling and  pain. Initial encounter. EXAM: PELVIS - 1-2 VIEW COMPARISON:  None. FINDINGS: There is no evidence of fracture or dislocation. Both femoral heads are seated normally within their respective acetabula. Mild degenerative change is noted at the lower lumbar spine. The sacroiliac joints are unremarkable in appearance. The visualized bowel gas pattern is grossly unremarkable in appearance. IMPRESSION: No evidence of fracture or dislocation. Electronically Signed   By: Roanna RaiderJeffery  Chang M.D.   On: 06/20/2016 01:02   Dg Shoulder Right  Result Date: 06/20/2016 CLINICAL DATA:  Status post fall, with right arm pain. Initial encounter. EXAM: RIGHT SHOULDER - 2+ VIEW COMPARISON:  None. FINDINGS: There is no evidence of fracture or dislocation. The right humeral head is seated within the glenoid fossa. Mild degenerative change is noted at the right acromioclavicular joint. No significant soft tissue abnormalities are seen. The visualized portions of the right lung are clear. IMPRESSION: No evidence of fracture or dislocation. Electronically Signed   By: Roanna RaiderJeffery  Chang M.D.   On: 06/20/2016 01:03   Ct Head Wo Contrast  Result Date: 06/20/2016 CLINICAL DATA:  Larey SeatFell while trying be 8 out of bed today. EXAM: CT HEAD WITHOUT CONTRAST CT CERVICAL SPINE WITHOUT CONTRAST TECHNIQUE: Multidetector CT imaging of the head and cervical spine was performed following the standard protocol without intravenous contrast. Multiplanar CT image reconstructions of the cervical spine were also generated. COMPARISON:  04/03/2016 FINDINGS: CT HEAD FINDINGS Brain: No evidence of acute infarction, hemorrhage, hydrocephalus, extra-axial collection or mass lesion/mass effect. Moderate generalized atrophy. Moderate hemispheric white matter hypodensity consistent with chronic small vessel ischemic disease. Vascular: No hyperdense vessel or unexpected calcification. Skull: Normal. Negative for fracture or focal lesion. Sinuses/Orbits: No acute finding.  Other: None. CT CERVICAL SPINE FINDINGS Alignment: Normal. Skull base and vertebrae: No acute fracture. No primary bone lesion or focal pathologic process. Soft tissues and spinal canal: No prevertebral fluid or swelling. No visible canal hematoma. Disc levels: Moderate cervical degenerative disc disease at C6-7. Facet articulations are intact. Upper chest: Negative. Other: None IMPRESSION: 1. No acute intracranial findings. There is moderate generalized atrophy and chronic appearing white matter hypodensities which likely represent small vessel ischemic disease. 2. Negative for  acute cervical spine fracture Electronically Signed   By: Ellery Plunk M.D.   On: 06/20/2016 01:02   Ct Cervical Spine Wo Contrast  Result Date: 06/20/2016 CLINICAL DATA:  Larey Seat while trying be 8 out of bed today. EXAM: CT HEAD WITHOUT CONTRAST CT CERVICAL SPINE WITHOUT CONTRAST TECHNIQUE: Multidetector CT imaging of the head and cervical spine was performed following the standard protocol without intravenous contrast. Multiplanar CT image reconstructions of the cervical spine were also generated. COMPARISON:  04/03/2016 FINDINGS: CT HEAD FINDINGS Brain: No evidence of acute infarction, hemorrhage, hydrocephalus, extra-axial collection or mass lesion/mass effect. Moderate generalized atrophy. Moderate hemispheric white matter hypodensity consistent with chronic small vessel ischemic disease. Vascular: No hyperdense vessel or unexpected calcification. Skull: Normal. Negative for fracture or focal lesion. Sinuses/Orbits: No acute finding. Other: None. CT CERVICAL SPINE FINDINGS Alignment: Normal. Skull base and vertebrae: No acute fracture. No primary bone lesion or focal pathologic process. Soft tissues and spinal canal: No prevertebral fluid or swelling. No visible canal hematoma. Disc levels: Moderate cervical degenerative disc disease at C6-7. Facet articulations are intact. Upper chest: Negative. Other: None IMPRESSION: 1. No  acute intracranial findings. There is moderate generalized atrophy and chronic appearing white matter hypodensities which likely represent small vessel ischemic disease. 2. Negative for acute cervical spine fracture Electronically Signed   By: Ellery Plunk M.D.   On: 06/20/2016 01:02   Dg Humerus Left  Result Date: 06/20/2016 CLINICAL DATA:  Status post fall. EXAM: LEFT HUMERUS - 2+ VIEW COMPARISON:  None. FINDINGS: There is no evidence of fracture or other focal bone lesions. Soft tissues are unremarkable. IMPRESSION: Negative. Electronically Signed   By: Deatra Robinson M.D.   On: 06/20/2016 01:05   Dg Femur Min 2 Views Right  Result Date: 06/20/2016 CLINICAL DATA:  Status post fall, with right leg pain and swelling. Initial encounter. EXAM: RIGHT FEMUR 2 VIEWS COMPARISON:  None. FINDINGS: The right femur appears grossly intact. The right femoral head remains seated at the acetabulum. Cortical irregularity is noted at the distal femur and tibial plateau, with marginal osteophytes noted arising at all 3 compartments. No knee joint effusion is seen. Scattered vascular calcifications noted. No additional soft tissue abnormalities are characterized on radiograph. IMPRESSION: 1. No evidence of fracture or dislocation. 2. Scattered vascular calcifications seen. 3. Mild tricompartmental osteoarthritis at the right knee. Electronically Signed   By: Roanna Raider M.D.   On: 06/20/2016 01:01    Procedures Procedures (including critical care time)  Medications Ordered in ED Medications - No data to display   Initial Impression / Assessment and Plan / ED Course  I have reviewed the triage vital signs and the nursing notes.  Pertinent labs & imaging results that were available during my care of the patient were reviewed by me and considered in my medical decision making (see chart for details).  Clinical Course   Patient from nursing facility after reportedly falling out of bed. Has ecchymosis and  hematoma to right proximal thigh. Level V caveat for dementia. Patient denies any head, neck, back, chest or abdominal pain. She is able to ambulate and is moving all extremities.  Traumatic imaging is negative. Patient is able to ambulate. She is not on anticoagulation.  She appears stable to return to her nursing facility.   Final Clinical Impressions(s) / ED Diagnoses   Final diagnoses:  Contusion of hip and thigh, right, initial encounter  Fall, initial encounter    New Prescriptions New Prescriptions   No medications  on file   I personally performed the services described in this documentation, which was scribed in my presence. The recorded information has been reviewed and is accurate.     Glynn OctaveStephen Ysabelle Goodroe, MD 06/20/16 [phone]

## 2016-06-19 NOTE — ED Triage Notes (Signed)
[facility]  Having swelling to left leg.  Did not hit her head, did not lose consciousness.  Patient hard of hearing, history of dementia.  Patient denies pain.

## 2016-06-20 NOTE — ED Notes (Signed)
Patient to radiology.

## 2016-06-20 NOTE — Discharge Instructions (Signed)
Xrays are negative for fractures. Ambulate with walker or other assistance as tolerated. Followup with PCP. Return to the ED if you develop new or worsening symptoms.

## 2016-10-16 ENCOUNTER — Emergency Department (HOSPITAL_COMMUNITY)
Admission: EM | Admit: 2016-10-16 | Discharge: 2016-10-17 | Disposition: A | Discharge status: Home / Self Care | Payer: Medicare Other | Attending: Emergency Medicine | Admitting: Emergency Medicine

## 2016-10-16 ENCOUNTER — Encounter (HOSPITAL_COMMUNITY): Payer: Self-pay | Admitting: *Deleted

## 2016-10-16 ENCOUNTER — Emergency Department (HOSPITAL_COMMUNITY): Payer: Medicare Other

## 2016-10-16 DIAGNOSIS — Z79899 Other long term (current) drug therapy: Secondary | ICD-10-CM | POA: Insufficient documentation

## 2016-10-16 DIAGNOSIS — Y929 Unspecified place or not applicable: Secondary | ICD-10-CM | POA: Insufficient documentation

## 2016-10-16 DIAGNOSIS — S0993XA Unspecified injury of face, initial encounter: Secondary | ICD-10-CM | POA: Diagnosis present

## 2016-10-16 DIAGNOSIS — I129 Hypertensive chronic kidney disease with stage 1 through stage 4 chronic kidney disease, or unspecified chronic kidney disease: Secondary | ICD-10-CM | POA: Insufficient documentation

## 2016-10-16 DIAGNOSIS — Y939 Activity, unspecified: Secondary | ICD-10-CM | POA: Insufficient documentation

## 2016-10-16 DIAGNOSIS — R930 Abnormal findings on diagnostic imaging of skull and head, not elsewhere classified: Secondary | ICD-10-CM | POA: Insufficient documentation

## 2016-10-16 DIAGNOSIS — S0511XA Contusion of eyeball and orbital tissues, right eye, initial encounter: Secondary | ICD-10-CM | POA: Insufficient documentation

## 2016-10-16 DIAGNOSIS — W19XXXA Unspecified fall, initial encounter: Secondary | ICD-10-CM | POA: Insufficient documentation

## 2016-10-16 DIAGNOSIS — S0083XA Contusion of other part of head, initial encounter: Secondary | ICD-10-CM

## 2016-10-16 DIAGNOSIS — N184 Chronic kidney disease, stage 4 (severe): Secondary | ICD-10-CM | POA: Insufficient documentation

## 2016-10-16 DIAGNOSIS — Y999 Unspecified external cause status: Secondary | ICD-10-CM | POA: Diagnosis not present

## 2016-10-16 NOTE — ED Triage Notes (Signed)
Pt brought in by [facility] for c/o fall; ems reports pt was found sitting on the floor, staff refused to put pt back in bed; pt denies any pain

## 2016-10-16 NOTE — ED Provider Notes (Signed)
[facility]-EMERGENCY DEPT Provider Note   CSN: [id number] Arrival date & time: 10/16/16  2039   By signing my name below, I, Talbert Nan, attest that this documentation has been prepared under the direction and in the presence of Loren Racer, MD. Electronically Signed: Talbert Nan, Scribe. 10/16/16. 9:02 PM.    History   Chief Complaint Chief Complaint  Patient presents with  . Fall   LEVEL 5 CAVEAT: HPI and ROS limited due to dementia.  HPI Angela Fuentes is a 83 y.o. female brought in by ambulance, who presents to the Emergency Department after unwitnessed fall; ems reports pt was found sitting on the floor, staff refused to put pt back in bed. Pt denies any pain. Pt states that she doesn't know why they brought her to the ED.    No language interpreter was used.    Past Medical History:  Diagnosis Date  . B12 deficiency   . Calculus of gallbladder without mention of cholecystitis or obstruction   . Cardiomegaly   . Carotid stenosis, bilateral    [place] 08/2013, rpt 6 mo  . Chest pain, unspecified   . CKD (chronic kidney disease) stage 4, GFR 15-29 ml/min (HCC)    s/p eval by renal 09/2010 (normal SPEP/UPEP)  . DDD (degenerative disc disease) 12/2000   Cervical series /MRI neck multi level degen changes 05/02: cervical x-ray DDD 06/2003  . Dementia   . History of CVA (cerebrovascular accident)    old R basal ganglia lacunar infarct by CT  . HLD (hyperlipidemia) 11/01/1997  . HTN (hypertension) 11/01/1997  . Hyperglycemia   . Labyrinthitis, unspecified   . Multiple thyroid nodules 01/2013   bilateral on carotid US  . Personal history of malignant neoplasm of ovary 1960s   s/p hysterectomy  . Syncope and collapse   . Vitamin D deficiency     Patient Active Problem List   Diagnosis Date Noted  . Gait instability 11/02/2014  . Multiple thyroid nodules 02/21/2013  . Secondary hyperparathyroidism (of renal origin) 02/25/2012  . B12 deficiency   . Medicare annual  wellness visit, initial 01/29/2012  . Cerumen impaction 01/29/2012  . Dementia with behavioral disturbance 01/29/2012  . Hearing loss 01/29/2012  . Vitamin D deficiency   . CKD (chronic kidney disease) stage 4, GFR 15-29 ml/min (HCC) 08/20/2010  . Syncope and collapse 05/15/2009  . CAROTID ARTERY STENOSIS, BILATERAL 10/09/2008  . CHEST PAIN 10/09/2008  . VERTIGO SECONDARY TO LABRYNTHITIS 04/18/2007  . HX, PERSONAL, MALIGNANCY, OVARY 04/18/2007  . HYPERGLYCEMIA 09/03/2004  . HYPERLIPIDEMIA 11/01/1997  . 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 CHOLELITHIASIS 08/03/1949    Past Surgical History:  Procedure Laterality Date  . adenosine myoview  12/21/2007   Normal  . BACK SURGERY     L5/S1 ( Dr. Ocie Bob)  . carotid US  08/2012   stable: 60-79% RICA (higher end), 40-59% LICA, rec rpt 6 mo  . CHOLECYSTECTOMY  08/03/1949  . US ECHOCARDIOGRAPHY  02/19/2005   Echo EF 65% tr AR grossly normal  . VAGINAL HYSTERECTOMY  1960   ovarian cancer     OB History    No data available       Home Medications    Prior to Admission medications   Medication Sig Start Date End Date Taking? Authorizing Provider  acetaminophen (TYLENOL) 325 MG tablet Take 325 mg by mouth every 4 (four) hours as needed for mild pain, fever or headache.   Yes Historical Provider,  MD  ALPRAZolam (XANAX) 0.25 MG tablet Take 0.25 mg by mouth every 8 (eight) hours as needed for anxiety.   Yes Historical Provider, MD  alum & mag hydroxide-simeth (MAALOX PLUS) 400-400-40 MG/5ML suspension Take 30 mLs by mouth every 6 (six) hours as needed for indigestion.   Yes Historical Provider, MD  aspirin 81 MG chewable tablet Chew 81 mg by mouth daily.   Yes Historical Provider, MD  atorvastatin (LIPITOR) 40 MG tablet Take 40 mg by mouth daily.   Yes Historical Provider, MD  cholecalciferol (VITAMIN D) 1000 units tablet Take 1,000 Units by mouth daily.   Yes Historical Provider, MD    diphenhydrAMINE (BENADRYL) 25 mg capsule Take 25 mg by mouth at bedtime.   Yes Historical Provider, MD  divalproex (DEPAKOTE SPRINKLE) 125 MG capsule Take 250 mg by mouth 2 (two) times daily.   Yes Historical Provider, MD  donepezil (ARICEPT) 10 MG tablet Take 1 tablet (10 mg total) by mouth at bedtime. 11/02/14  Yes Eustaquio BoydenJavier Gutierrez, MD  guaifenesin (ROBITUSSIN) 100 MG/5ML syrup Take 200 mg by mouth 3 (three) times daily as needed for cough.   Yes Historical Provider, MD  lanolin/mineral oil (KERI/THERA-DERM) LOTN Apply 1 application topically as needed for dry skin.   Yes Historical Provider, MD  lisinopril (PRINIVIL,ZESTRIL) 20 MG tablet Take 20 mg by mouth daily.   Yes Historical Provider, MD  loperamide (IMODIUM) 2 MG capsule Take 2 mg by mouth as needed for diarrhea or loose stools.   Yes Historical Provider, MD  magnesium hydroxide (MILK OF MAGNESIA) 400 MG/5ML suspension Take 30 mLs by mouth daily as needed for mild constipation.   Yes Historical Provider, MD  memantine (NAMENDA) 10 MG tablet Take 10 mg by mouth at bedtime.    Yes Historical Provider, MD  mirtazapine (REMERON) 7.5 MG tablet Take 7.5 mg by mouth at bedtime.   Yes Historical Provider, MD  neomycin-bacitracin-polymyxin (NEOSPORIN) [phone] ointment Apply 1 application topically 4 (four) times daily.   Yes Historical Provider, MD  traZODone (DESYREL) 50 MG tablet Take 0.5-1 tablets (25-50 mg total) by mouth at bedtime as needed for sleep. 08/15/15  Yes Eustaquio BoydenJavier Gutierrez, MD  triamterene-hydrochlorothiazide (MAXZIDE-25) 37.5-25 MG per tablet Take 1 tablet by mouth daily. 07/11/14  Yes Eustaquio BoydenJavier Gutierrez, MD    Family History Family History  Problem Relation Age of Onset  . Hypertension Mother   . Stroke Mother 2571    cva (hemm)  . Cancer Father     Prostate cancer  . COPD Brother     emphysema    Social History Social History  Substance Use Topics  . Smoking status: Never Smoker  . Smokeless tobacco: Never Used  .  Alcohol use No     Allergies   Penicillins   Review of Systems Review of Systems  Unable to perform ROS: Dementia     Physical Exam Updated Vital Signs BP (!) 147/65 (BP Location: Left Arm)   Pulse 77   Temp 97.7 F (36.5 C) (Oral)   Resp 20   Ht 5\' 5"  (1.651 m)   Wt 120 lb (54.4 kg)   SpO2 99%   BMI 19.97 kg/m   Physical Exam  Constitutional: She appears well-developed and well-nourished. No distress.  HENT:  Head: Normocephalic.  Mouth/Throat: Oropharynx is clear and moist.  Patient with right subacute periorbital ecchymosis. Midface is stable. No intraoral trauma.  Eyes: EOM are normal. Pupils are equal, round, and reactive to light.  Neck: Normal range of  motion. Neck supple.  No posterior midline tenderness cervical tenderness to palpation.  Cardiovascular: Normal rate and regular rhythm.   Pulmonary/Chest: Effort normal and breath sounds normal.  Abdominal: Soft. Bowel sounds are normal. There is no tenderness. There is no rebound and no guarding.  Musculoskeletal: Normal range of motion. She exhibits no edema or tenderness.  No focal midline thoracic or lumbar tenderness. Pelvis is stable. Distal pulses intact.  Neurological: She is alert.  Oriented only to person. 5/5 motor in all extremities. Sensation fully intact.  Skin: Skin is warm and dry. Capillary refill takes less than 2 seconds. No rash noted. No erythema.  Psychiatric:  Confused and mildly agitated.  Nursing note and vitals reviewed.    ED Treatments / Results   DIAGNOSTIC STUDIES: Oxygen Saturation is 99% on room air, normal by my interpretation.    COORDINATION OF CARE: 9:03 PM Discussed treatment plan.    Labs (all labs ordered are listed, but only abnormal results are displayed) Labs Reviewed - No data to display  EKG  EKG Interpretation None       Radiology Ct Head Wo Contrast  Result Date: 10/16/2016 CLINICAL DATA:  Fall EXAM: CT HEAD WITHOUT CONTRAST TECHNIQUE:  Contiguous axial images were obtained from the base of the skull through the vertex without intravenous contrast. COMPARISON:  06/20/2016 FINDINGS: Brain: No acute territorial infarction, intracranial hemorrhage or focal mass lesion is visualized. Moderate periventricular and subcortical white matter small vessel ischemic changes. Old appearing lacunar infarcts in the bilateral basal ganglia. Moderate atrophy. Stable ventricle size. Vascular: No hyperdense vessels.  Carotid artery calcifications. Skull: No fracture.  No suspicious bone lesion. Sinuses/Orbits: Paranasal sinuses are clear. No acute orbital abnormality. Other: None IMPRESSION: No CT evidence for acute intracranial abnormality. Atrophy and periventricular white matter small vessel ischemic changes. Electronically Signed   By: Jasmine Pang M.D.   On: 10/16/2016 22:38    Procedures Procedures (including critical care time)  Medications Ordered in ED Medications - No data to display   Initial Impression / Assessment and Plan / ED Course  I have reviewed the triage vital signs and the nursing notes.  Pertinent labs & imaging results that were available during my care of the patient were reviewed by me and considered in my medical decision making (see chart for details).      Patient is at her baseline mental status. No evidence of acute injury. Discharge back to nursing home. Final Clinical Impressions(s) / ED Diagnoses   Final diagnoses:  Fall, initial encounter  Contusion of face, initial encounter    New Prescriptions New Prescriptions   No medications on file   I personally performed the services described in this documentation, which was scribed in my presence. The recorded information has been reviewed and is accurate.       Loren Racer, MD 10/16/16 [phone]

## 2017-03-03 DEATH — deceased

## 2017-09-11 IMAGING — CT CT HEAD W/O CM
3 series · 16 of 47 positions shown, 19 images · non-contrast
Comparison: 06/20/2016

CLINICAL DATA: Fall

EXAM:
CT HEAD WITHOUT CONTRAST
TECHNIQUE: Contiguous axial images were obtained from the base of the skull
through the vertex without intravenous contrast.

[Series 2: head wo · axial · 0.46mm/px · z∈[+1474,+1599]mm · 10 of 31 slices shown, 13 images]
[im 3/31  brain]
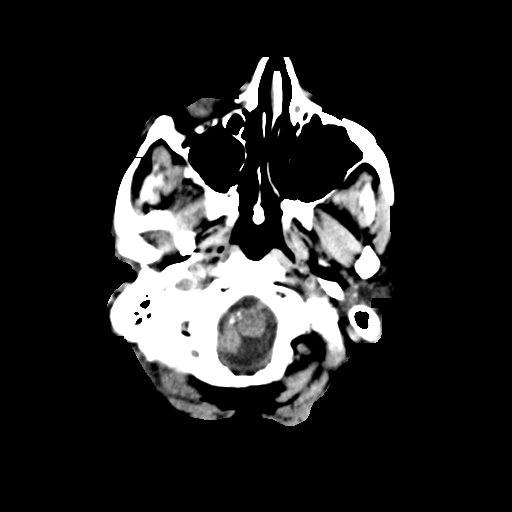
[im 3/31  bone]
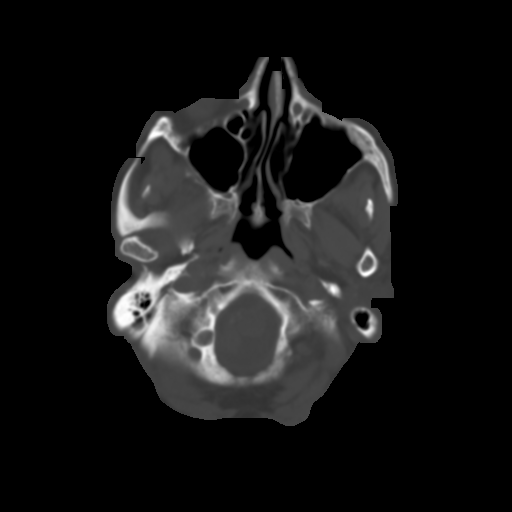
[im 6/31  brain]
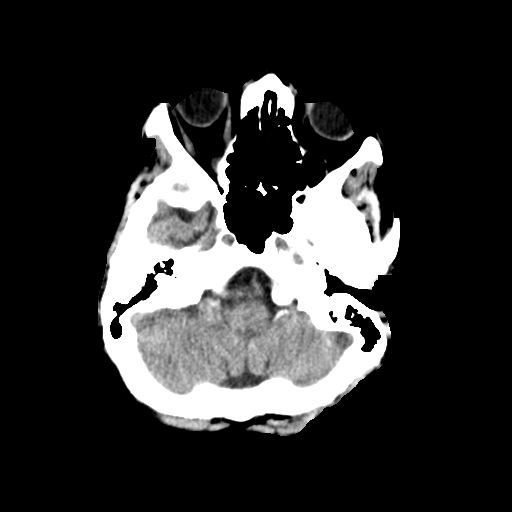
[im 9/31  brain]
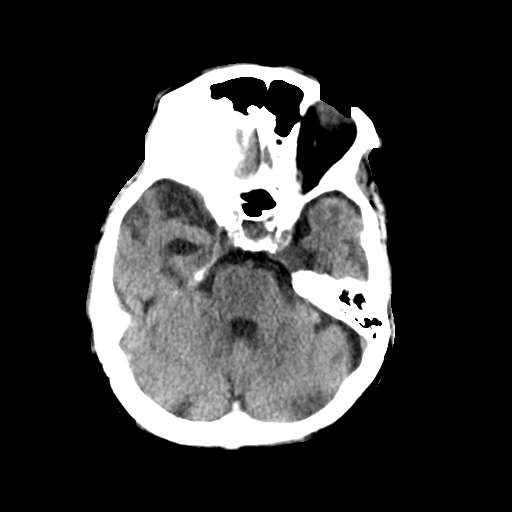
[im 11/31  brain]
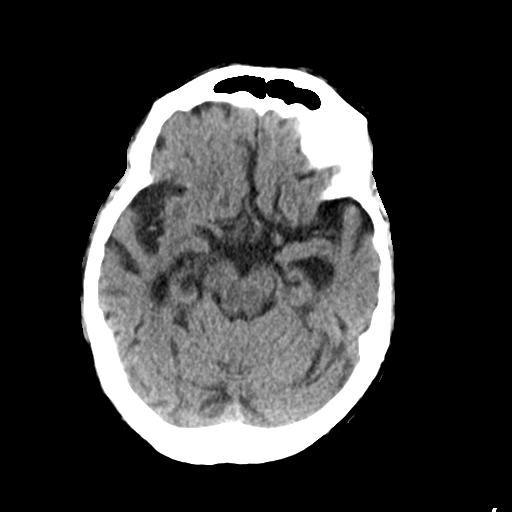
[im 14/31  brain]
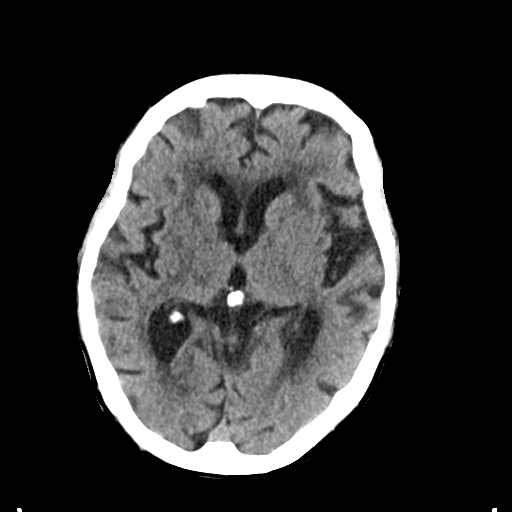
[im 14/31  bone]
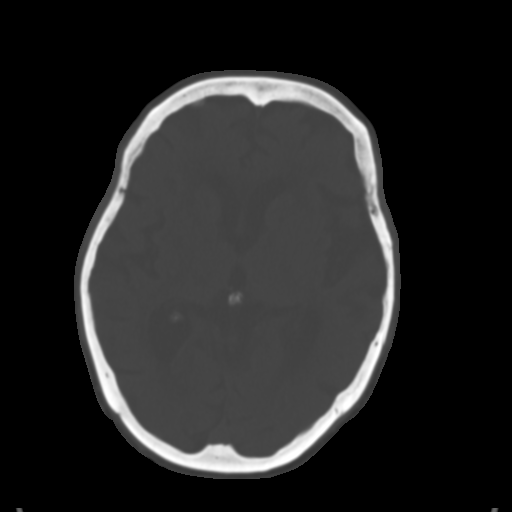
[im 17/31  brain]
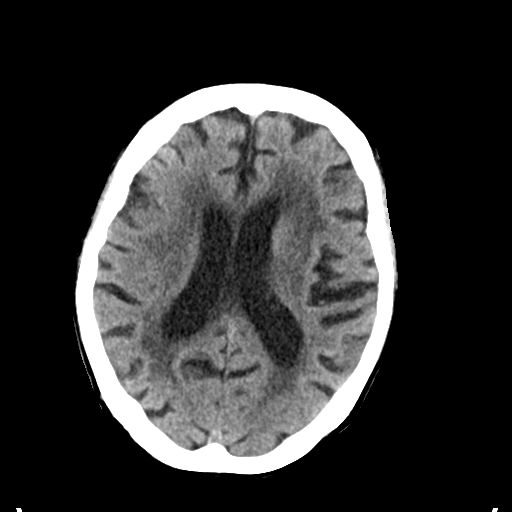
[im 20/31  brain]
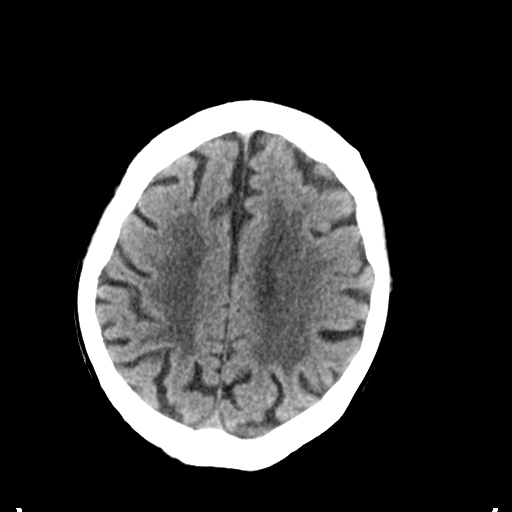
[im 23/31  brain]
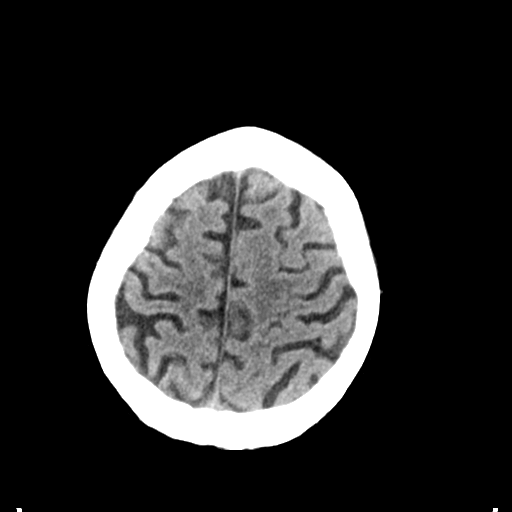
[im 25/31  brain]
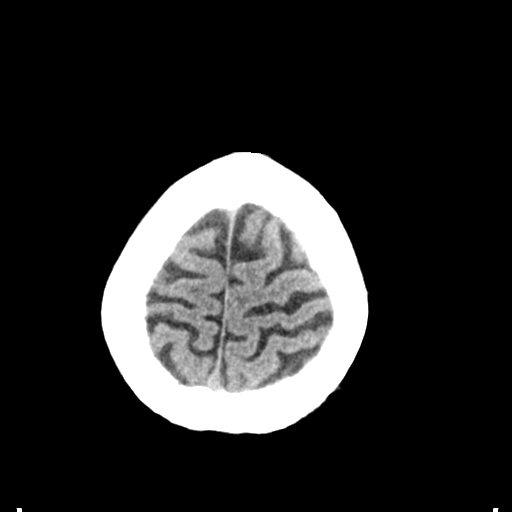
[im 25/31  bone]
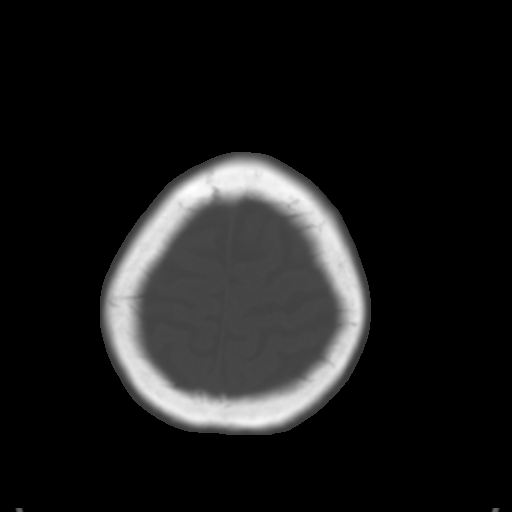
[im 28/31  brain]
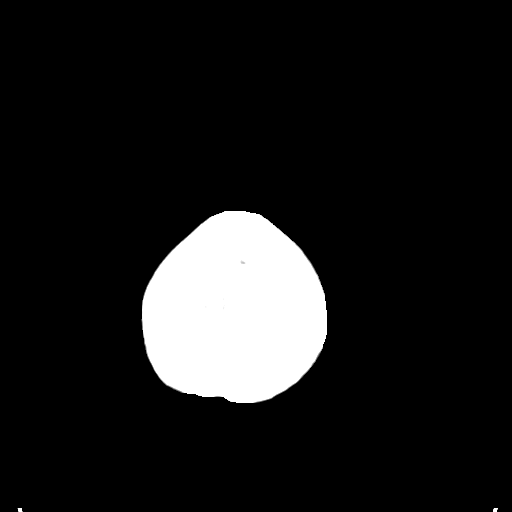

[Series 4: coronal soft tissue · coronal · 0.37mm/px · 3 of 70 slices shown]
[im 24/70  brain]
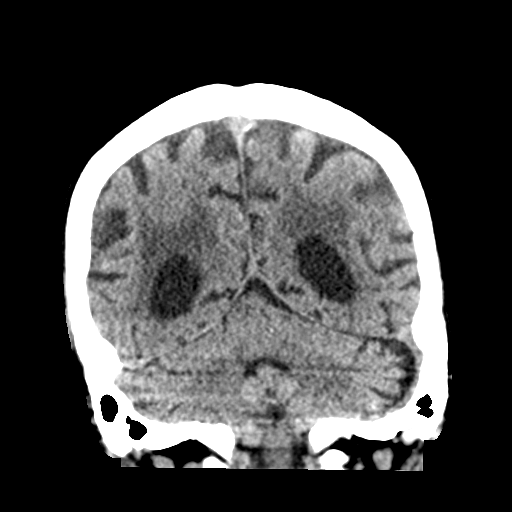
[im 31/70  brain]
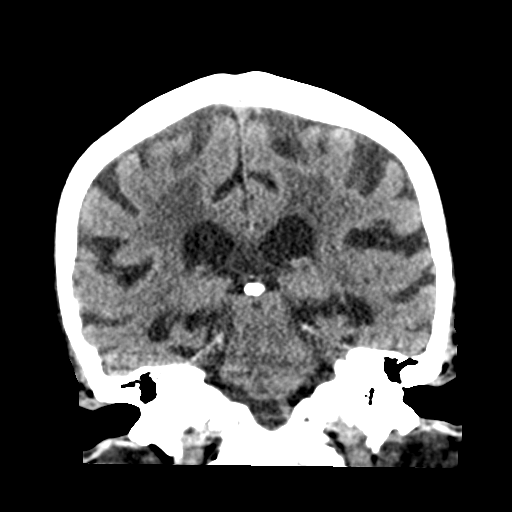
[im 39/70  brain]
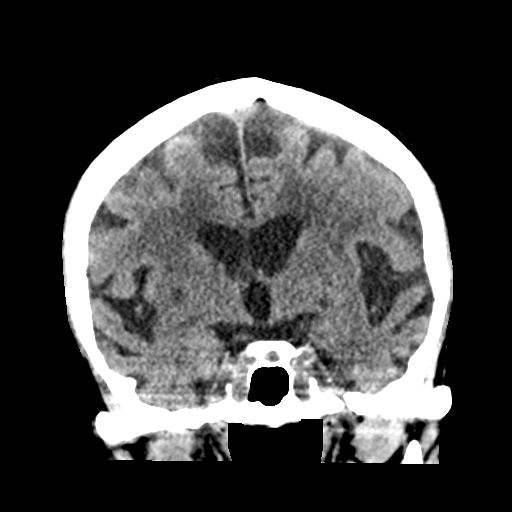

[Series 5: sagittal soft tissue · sagittal · 0.35mm/px · 3 of 67 slices shown]
[im 23/67  brain]
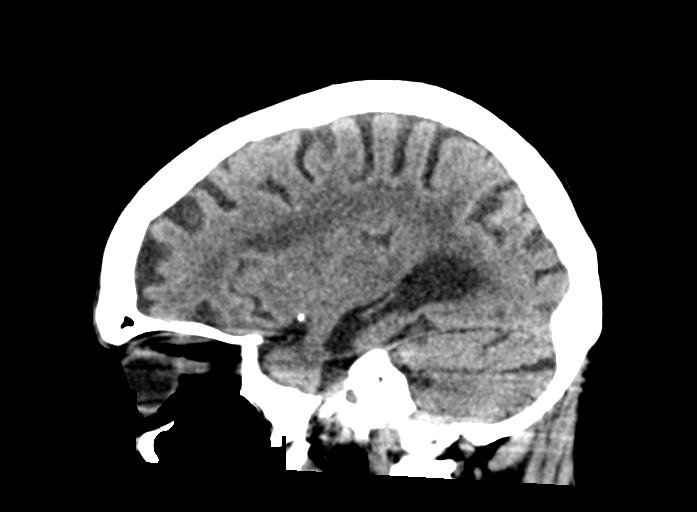
[im 34/67  brain]
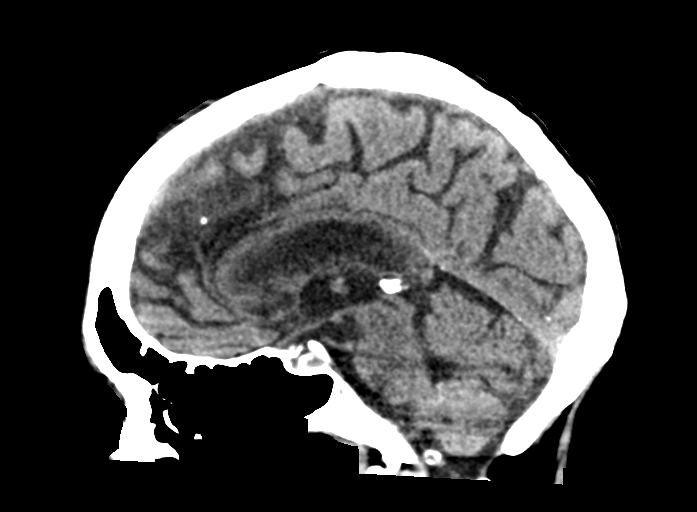
[im 45/67  brain]
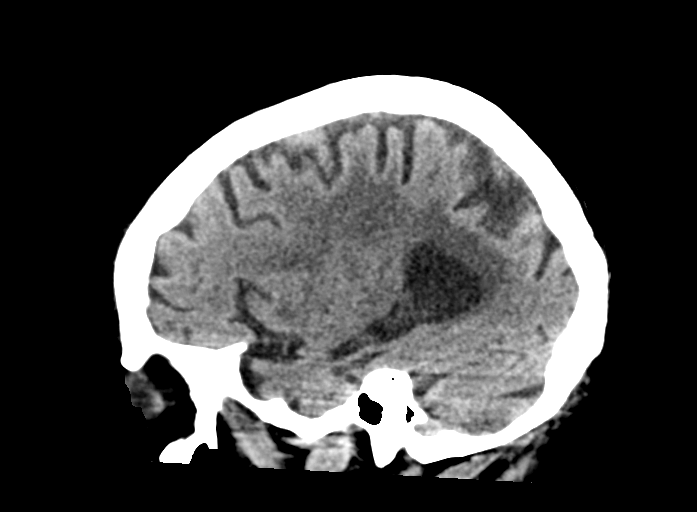

[16 of 47 positions shown; findings below may reference images not displayed]

FINDINGS: Brain: No acute territorial infarction, intracranial hemorrhage or
focal mass lesion is visualized. Moderate periventricular and
subcortical white matter small vessel ischemic changes. Old
appearing lacunar infarcts in the bilateral basal ganglia. Moderate
atrophy. Stable ventricle size.

Vascular: No hyperdense vessels.  Carotid artery calcifications.

Skull: No fracture.  No suspicious bone lesion.

Sinuses/Orbits: Paranasal sinuses are clear. No acute orbital
abnormality.

Other: None
IMPRESSION: No CT evidence for acute intracranial abnormality. Atrophy and
periventricular white matter small vessel ischemic changes.
